# Patient Record
Sex: Male | Born: 1980 | Race: Black or African American | Hispanic: No | Marital: Married | Smoking: Never smoker
Health system: Southern US, Community
[De-identification: ages and names within clinical notes are randomized; demographics above are authoritative.]

## PROBLEM LIST (undated history)

## (undated) ENCOUNTER — Ambulatory Visit: Discharge status: Absent without leave | Payer: BLUE CROSS/BLUE SHIELD

## (undated) DIAGNOSIS — F32A Depression, unspecified: Secondary | ICD-10-CM

## (undated) DIAGNOSIS — T7840XA Allergy, unspecified, initial encounter: Secondary | ICD-10-CM

## (undated) HISTORY — DX: Allergy, unspecified, initial encounter: T78.40XA

## (undated) HISTORY — PX: HERNIA REPAIR: SHX51

## (undated) HISTORY — DX: Depression, unspecified: F32.A

---

## 2003-07-01 ENCOUNTER — Emergency Department (HOSPITAL_COMMUNITY): Admission: EM | Admit: 2003-07-01 | Discharge: 2003-07-01 | Payer: Self-pay

## 2008-06-03 ENCOUNTER — Emergency Department (HOSPITAL_COMMUNITY): Admission: EM | Admit: 2008-06-03 | Discharge: 2008-06-03 | Payer: Self-pay | Admitting: Emergency Medicine

## 2008-06-04 ENCOUNTER — Emergency Department (HOSPITAL_COMMUNITY): Admission: EM | Admit: 2008-06-04 | Discharge: 2008-06-05 | Payer: Self-pay | Admitting: Emergency Medicine

## 2008-06-09 ENCOUNTER — Emergency Department (HOSPITAL_COMMUNITY): Admission: EM | Admit: 2008-06-09 | Discharge: 2008-06-09 | Payer: Self-pay | Admitting: Family Medicine

## 2008-10-19 ENCOUNTER — Emergency Department (HOSPITAL_COMMUNITY): Admission: EM | Admit: 2008-10-19 | Discharge: 2008-10-19 | Payer: Self-pay | Admitting: Emergency Medicine

## 2011-09-05 LAB — DIFFERENTIAL
Eosinophils Absolute: 0.2
Eosinophils Relative: 3
Lymphs Abs: 1.7
Monocytes Absolute: 0.5

## 2011-09-05 LAB — POCT I-STAT, CHEM 8
BUN: 9
Calcium, Ion: 1.18
Creatinine, Ser: 1.2
Hemoglobin: 15
Sodium: 138
TCO2: 28

## 2011-09-05 LAB — CBC
HCT: 40.6
Hemoglobin: 13.9
MCV: 85.9
Platelets: 268
RDW: 13.9
WBC: 5.4

## 2017-02-21 ENCOUNTER — Encounter (HOSPITAL_COMMUNITY): Payer: Self-pay

## 2017-02-21 DIAGNOSIS — K130 Diseases of lips: Secondary | ICD-10-CM | POA: Insufficient documentation

## 2017-02-21 NOTE — ED Triage Notes (Signed)
Pt states that Monday he had a bump on the outside of his mouth and went to UC and placed on doxycycline, Pt states that on Wed his bottom lip and cheek started to swell, denies SOB. Speaks in full sentences.

## 2017-02-22 ENCOUNTER — Emergency Department (HOSPITAL_COMMUNITY)
Admission: EM | Admit: 2017-02-22 | Discharge: 2017-02-22 | Disposition: A | Discharge status: Home / Self Care | Payer: BLUE CROSS/BLUE SHIELD | Attending: Emergency Medicine | Admitting: Emergency Medicine

## 2017-02-22 DIAGNOSIS — K130 Diseases of lips: Secondary | ICD-10-CM

## 2017-02-22 MED ORDER — ACETAMINOPHEN 325 MG PO TABS
650.0000 mg | ORAL_TABLET | Freq: Once | ORAL | Status: AC
  Administered 2017-02-22: 650 mg via ORAL
  Filled 2017-02-22: qty 2

## 2017-02-22 NOTE — ED Provider Notes (Signed)
[facility] Provider Note   CSN: [id number] Arrival date & time: 02/21/17  1939     History   Chief Complaint Chief Complaint  Patient presents with  . Facial Swelling    HPI Reginald Bush is a 36 y.o. male who presents with lip swelling. He states that he noticed a "bump" on the left side of his lower lip on Monday. It worsened over the next couple days therefore he went to UC who placed him on Doxy and told him that he had an ingrown hair. He has been taking this medicine and has not missed any doses. Also been doing warm compresses but the pain and swelling has worsened. He has been taking Tylenol for pain which provides mild relief. Denies fever, drainage, rash, throat swelling, SOB.  HPI  History reviewed. No pertinent past medical history.  There are no active problems to display for this patient.   History reviewed. No pertinent surgical history.     Home Medications    Prior to Admission medications   Not on File    Family History No family history on file.  Social History Social History  Substance Use Topics  . Smoking status: Never Smoker  . Smokeless tobacco: Never Used  . Alcohol use No     Allergies   Shellfish allergy and Iodine   Review of Systems Review of Systems  Constitutional: Negative for fever.  HENT: Positive for facial swelling (lower lip). Negative for trouble swallowing.   Respiratory: Negative for shortness of breath.   Skin: Negative for rash.  All other systems reviewed and are negative.    Physical Exam Updated Vital Signs BP (!) 165/97 (BP Location: Right Arm)   Pulse 77   Temp 97.7 F (36.5 C) (Oral)   Resp 14   Ht 5\' 9"  (1.753 m)   Wt 98.4 kg   SpO2 100%   BMI 32.05 kg/m   Physical Exam  Constitutional: He is oriented to person, place, and time. He appears well-developed and well-nourished. No distress.  HENT:  Head: Normocephalic and atraumatic.  Eyes: Conjunctivae are normal. Pupils  are equal, round, and reactive to light. Right eye exhibits no discharge. Left eye exhibits no discharge. No scleral icterus.  Neck: Normal range of motion.  Cardiovascular: Normal rate.   Pulmonary/Chest: Effort normal. No respiratory distress.  Abdominal: He exhibits no distension.  Neurological: He is alert and oriented to person, place, and time.  Skin: Skin is warm and dry.  Abscess on left lower lip without drainage  Psychiatric: He has a normal mood and affect. His behavior is normal.  Nursing note and vitals reviewed.    ED Treatments / Results  Labs (all labs ordered are listed, but only abnormal results are displayed) Labs Reviewed - No data to display  EKG  EKG Interpretation None       Radiology No results found.  Procedures Procedures (including critical care time)  INCISION AND DRAINAGE Performed by: Bethel Born Consent: Verbal consent obtained. Risks and benefits: risks, benefits and alternatives were discussed Type: abscess  Body area: Left lower lip  Anesthesia: local infiltration  Incision was made with a scalpel.  Local anesthetic: lidocaine 2% with epinephrine  Anesthetic total: 3 ml  Complexity: simple Blunt dissection to break up loculations  Drainage: purulent  Drainage amount: 2cc   Packing material: none  Patient tolerance: Patient tolerated the procedure well with no immediate complications.     Medications Ordered in ED Medications  acetaminophen (TYLENOL) tablet 650 mg (650 mg Oral Given 02/22/17 0151)     Initial Impression / Assessment and Plan / ED Course  I have reviewed the triage vital signs and the nursing notes.  Pertinent labs & imaging results that were available during my care of the patient were reviewed by me and considered in my medical decision making (see chart for details).  36 year old male presents with abscess amenable to I&D. I&D performed and patient tolerated well. Patient is afebrile,  tolerating PO. Discussed wound care and signs of infection (fever, chills, increasing pain, redness, or drainage at site). Also advised continue Doxy and warm compresses. Return precautions given.   Final Clinical Impressions(s) / ED Diagnoses   Final diagnoses:  Abscess, lip    New Prescriptions There are no discharge medications for this patient.    Bethel BornKelly Marie Gekas, PA-C 02/22/17 1530    Dione Boozeavid Glick, MD 02/22/17 [id number]

## 2017-02-22 NOTE — Discharge Instructions (Signed)
Keep area clean Take Tylenol/Ibuprofen for pain Continue Doxycycline  Return if symptoms are worsening

## 2018-05-28 ENCOUNTER — Ambulatory Visit: Payer: Self-pay | Admitting: Nurse Practitioner

## 2018-05-28 VITALS — BP 130/88 | HR 82 | Temp 97.7°F | Resp 16 | Wt 234.0 lb

## 2018-05-28 DIAGNOSIS — Z0000 Encounter for general adult medical examination without abnormal findings: Secondary | ICD-10-CM

## 2018-05-28 MED ORDER — ALBUTEROL SULFATE HFA 108 (90 BASE) MCG/ACT IN AERS: 2.0000 | INHALATION_SPRAY | Freq: Four times a day (QID) | RESPIRATORY_TRACT | 0 refills | Status: DC | PRN

## 2018-05-28 NOTE — Patient Instructions (Addendum)
Health Maintenance, Male [phone] or [phone] A healthy lifestyle and preventive care is important for your health and wellness. Ask your health care provider about what schedule of regular examinations is right for you. What should I know about weight and diet? Eat a Healthy Diet  Eat plenty of vegetables, fruits, whole grains, low-fat dairy products, and lean protein.  Do not eat a lot of foods high in solid fats, added sugars, or salt.  Maintain a Healthy Weight Regular exercise can help you achieve or maintain a healthy weight. You should:  Do at least 150 minutes of exercise each week. The exercise should increase your heart rate and make you sweat (moderate-intensity exercise).  Do strength-training exercises at least twice a week.  Watch Your Levels of Cholesterol and Blood Lipids  Have your blood tested for lipids and cholesterol every 5 years starting at 37 years of age. If you are at high risk for heart disease, you should start having your blood tested when you are 37 years old. You may need to have your cholesterol levels checked more often if: ? Your lipid or cholesterol levels are high. ? You are older than 37 years of age. ? You are at high risk for heart disease.  What should I know about cancer screening? Many types of cancers can be detected early and may often be prevented. Lung Cancer  You should be screened every year for lung cancer if: ? You are a current smoker who has smoked for at least 30 years. ? You are a former smoker who has quit within the past 15 years.  Talk to your health care provider about your screening options, when you should start screening, and how often you should be screened.  Colorectal Cancer  Routine colorectal cancer screening usually begins at 37 years of age and should be repeated every 5-10 years until you are 37 years old. You may need to be screened more often if early forms of precancerous  polyps or small growths are found. Your health care provider may recommend screening at an earlier age if you have risk factors for colon cancer.  Your health care provider may recommend using home test kits to check for hidden blood in the stool.  A small camera at the end of a tube can be used to examine your colon (sigmoidoscopy or colonoscopy). This checks for the earliest forms of colorectal cancer.  Prostate and Testicular Cancer  Depending on your age and overall health, your health care provider may do certain tests to screen for prostate and testicular cancer.  Talk to your health care provider about any symptoms or concerns you have about testicular or prostate cancer.  Skin Cancer  Check your skin from head to toe regularly.  Tell your health care provider about any new moles or changes in moles, especially if: ? There is a change in a mole's size, shape, or color. ? You have a mole that is larger than a pencil eraser.  Always use sunscreen. Apply sunscreen liberally and repeat throughout the day.  Protect yourself by wearing long sleeves, pants, a wide-brimmed hat, and sunglasses when outside.  What should I know about heart disease, diabetes, and high blood pressure?  If you are 48-52 years of age, have your blood pressure checked every 3-5 years. If you are 49 years of age or older, have your blood pressure checked every year. You should have your blood pressure measured twice-once when you are at a  hospital or clinic, and once when you are not at a hospital or clinic. Record the average of the two measurements. To check your blood pressure when you are not at a hospital or clinic, you can use: ? An automated blood pressure machine at a pharmacy. ? A home blood pressure monitor.  Talk to your health care provider about your target blood pressure.  If you are between 41-43 years old, ask your health care provider if you should take aspirin to prevent heart  disease.  Have regular diabetes screenings by checking your fasting blood sugar level. ? If you are at a normal weight and have a low risk for diabetes, have this test once every three years after the age of 41. ? If you are overweight and have a high risk for diabetes, consider being tested at a younger age or more often.  A one-time screening for abdominal aortic aneurysm (AAA) by ultrasound is recommended for men aged 30-75 years who are current or former smokers. What should I know about preventing infection? Hepatitis B If you have a higher risk for hepatitis B, you should be screened for this virus. Talk with your health care provider to find out if you are at risk for hepatitis B infection. Hepatitis C Blood testing is recommended for:  Everyone born from [birth year] through 1965.  Anyone with known risk factors for hepatitis C.  Sexually Transmitted Diseases (STDs)  You should be screened each year for STDs including gonorrhea and chlamydia if: ? You are sexually active and are younger than 37 years of age. ? You are older than 37 years of age and your health care provider tells you that you are at risk for this type of infection. ? Your sexual activity has changed since you were last screened and you are at an increased risk for chlamydia or gonorrhea. Ask your health care provider if you are at risk.  Talk with your health care provider about whether you are at high risk of being infected with HIV. Your health care provider may recommend a prescription medicine to help prevent HIV infection.  What else can I do?  Schedule regular health, dental, and eye exams.  Stay current with your vaccines (immunizations).  Do not use any tobacco products, such as cigarettes, chewing tobacco, and e-cigarettes. If you need help quitting, ask your health care provider.  Limit alcohol intake to no more than 2 drinks per day. One drink equals 12 ounces of beer, 5 ounces of wine, or 1 ounces of  hard liquor.  Do not use street drugs.  Do not share needles.  Ask your health care provider for help if you need support or information about quitting drugs.  Tell your health care provider if you often feel depressed.  Tell your health care provider if you have ever been abused or do not feel safe at home. This information is not intended to replace advice given to you by your health care provider. Make sure you discuss any questions you have with your health care provider. Document Released: 05/23/2008 Document Revised: 07/24/2016 Document Reviewed: 08/29/2015 Elsevier Interactive Patient Education  2018 [place] 18-39 Years, Male Preventive care refers to lifestyle choices and visits with your health care provider that can promote health and wellness. What does preventive care include?  A yearly physical exam. This is also called an annual well check.  Dental exams once or twice a year.  Routine eye exams. Ask your health care  provider how often you should have your eyes checked.  Personal lifestyle choices, including: ? Daily care of your teeth and gums. ? Regular physical activity. ? Eating a healthy diet. ? Avoiding tobacco and drug use. ? Limiting alcohol use. ? Practicing safe sex. What happens during an annual well check? The services and screenings done by your health care provider during your annual well check will depend on your age, overall health, lifestyle risk factors, and family history of disease. Counseling Your health care provider may ask you questions about your:  Alcohol use.  Tobacco use.  Drug use.  Emotional well-being.  Home and relationship well-being.  Sexual activity.  Eating habits.  Work and work Statistician.  Screening You may have the following tests or measurements:  Height, weight, and BMI.  Blood pressure.  Lipid and cholesterol levels. These may be checked every 5 years starting at age  42.  Diabetes screening. This is done by checking your blood sugar (glucose) after you have not eaten for a while (fasting).  Skin check.  Hepatitis C blood test.  Hepatitis B blood test.  Sexually transmitted disease (STD) testing.  Discuss your test results, treatment options, and if necessary, the need for more tests with your health care provider. Vaccines Your health care provider may recommend certain vaccines, such as:  Influenza vaccine. This is recommended every year.  Tetanus, diphtheria, and acellular pertussis (Tdap, Td) vaccine. You may need a Td booster every 10 years.  Varicella vaccine. You may need this if you have not been vaccinated.  HPV vaccine. If you are 67 or younger, you may need three doses over 6 months.  Measles, mumps, and rubella (MMR) vaccine. You may need at least one dose of MMR.You may also need a second dose.  Pneumococcal 13-valent conjugate (PCV13) vaccine. You may need this if you have certain conditions and have not been vaccinated.  Pneumococcal polysaccharide (PPSV23) vaccine. You may need one or two doses if you smoke cigarettes or if you have certain conditions.  Meningococcal vaccine. One dose is recommended if you are age 88-21 years and a first-year college student living in a residence hall, or if you have one of several medical conditions. You may also need additional booster doses.  Hepatitis A vaccine. You may need this if you have certain conditions or if you travel or work in places where you may be exposed to hepatitis A.  Hepatitis B vaccine. You may need this if you have certain conditions or if you travel or work in places where you may be exposed to hepatitis B.  Haemophilus influenzae type b (Hib) vaccine. You may need this if you have certain risk factors.  Talk to your health care provider about which screenings and vaccines you need and how often you need them. This information is not intended to replace advice given  to you by your health care provider. Make sure you discuss any questions you have with your health care provider. Document Released: 01/21/2002 Document Revised: 08/14/2016 Document Reviewed: 09/26/2015 Elsevier Interactive Patient Education  2018 [employer].  Preventing Hypertension Hypertension, commonly called high blood pressure, is when the force of blood pumping through the arteries is too strong. Arteries are blood vessels that carry blood from the heart throughout the body. Over time, hypertension can damage the arteries and decrease blood flow to important parts of the body, including the brain, heart, and kidneys. Often, hypertension does not cause symptoms until blood pressure is very high. For this  reason, it is important to have your blood pressure checked on a regular basis. Hypertension can often be prevented with diet and lifestyle changes. If you already have hypertension, you can control it with diet and lifestyle changes, as well as medicine. What nutrition changes can be made? Maintain a healthy diet. This includes:  Eating less salt (sodium). Ask your health care provider how much sodium is safe for you to have. The general recommendation is to consume less than 1 tsp (2,300 mg) of sodium a day. ? Do not add salt to your food. ? Choose low-sodium options when grocery shopping and eating out.  Limiting fats in your diet. You can do this by eating low-fat or fat-free dairy products and by eating less red meat.  Eating more fruits, vegetables, and whole grains. Make a goal to eat: ? 1-2 cups of fresh fruits and vegetables each day. ? 3-4 servings of whole grains each day.  Avoiding foods and beverages that have added sugars.  Eating fish that contain healthy fats (omega-3 fatty acids), such as mackerel or salmon.  If you need help putting together a healthy eating plan, try the DASH diet. This diet is high in fruits, vegetables, and whole grains. It is low in sodium, red  meat, and added sugars. DASH stands for Dietary Approaches to Stop Hypertension. What lifestyle changes can be made?  Lose weight if you are overweight. Losing just 3?5% of your body weight can help prevent or control hypertension. ? For example, if your present weight is 200 lb (91 kg), a loss of 3-5% of your weight means losing 6-10 lb (2.7-4.5 kg). ? Ask your health care provider to help you with a diet and exercise plan to safely lose weight.  Get enough exercise. Do at least 150 minutes of moderate-intensity exercise each week. ? You could do this in short exercise sessions several times a day, or you could do longer exercise sessions a few times a week. For example, you could take a brisk 10-minute walk or bike ride, 3 times a day, for 5 days a week.  Find ways to reduce stress, such as exercising, meditating, listening to music, or taking a yoga class. If you need help reducing stress, ask your health care provider.  Do not smoke. This includes e-cigarettes. Chemicals in tobacco and nicotine products raise your blood pressure each time you smoke. If you need help quitting, ask your health care provider.  Avoid alcohol. If you drink alcohol, limit alcohol intake to no more than 1 drink a day for nonpregnant women and 2 drinks a day for men. One drink equals 12 oz of beer, 5 oz of wine, or 1 oz of hard liquor. Why are these changes important? Diet and lifestyle changes can help you prevent hypertension, and they may make you feel better overall and improve your quality of life. If you have hypertension, making these changes will help you control it and help prevent major complications, such as:  Hardening and narrowing of arteries that supply blood to: ? Your heart. This can cause a heart attack. ? Your brain. This can cause a stroke. ? Your kidneys. This can cause kidney failure.  Stress on your heart muscle, which can cause heart failure.  What can I do to lower my risk?  Work with  your health care provider to make a hypertension prevention plan that works for you. Follow your plan and keep all follow-up visits as told by your health care provider.  Learn how to check your blood pressure at home. Make sure that you know your personal target blood pressure, as told by your health care provider. How is this treated? In addition to diet and lifestyle changes, your health care provider may recommend medicines to help lower your blood pressure. You may need to try a few different medicines to find what works best for you. You also may need to take more than one medicine. Take over-the-counter and prescription medicines only as told by your health care provider. Where to find support: Your health care provider can help you prevent hypertension and help you keep your blood pressure at a healthy level. Your local hospital or your community may also provide support services and prevention programs. The American Heart Association offers an online support network at: CheapBootlegs.com.cy Where to find more information: Learn more about hypertension from:  National Heart, Lung, and Blood Institute: ElectronicHangman.is  Centers for Disease Control and Prevention: https://ingram.com/  American Academy of Family Physicians: http://familydoctor.org/familydoctor/en/diseases-conditions/high-blood-pressure.printerview.all.html  Learn more about the DASH diet from:  [place], Lung, and [place]: https://www.reyes.com/  Contact a health care provider if:  You think you are having a reaction to medicines you have taken.  You have recurrent headaches or feel dizzy.  You have swelling in your ankles.  You have trouble with your vision. Summary  Hypertension often does not cause any symptoms until blood pressure is very high. It is important to get your blood pressure checked  regularly.  Diet and lifestyle changes are the most important steps in preventing hypertension.  By keeping your blood pressure in a healthy range, you can prevent complications like heart attack, heart failure, stroke, and kidney failure.  Work with your health care provider to make a hypertension prevention plan that works for you. This information is not intended to replace advice given to you by your health care provider. Make sure you discuss any questions you have with your health care provider. Document Released: 12/10/2015 Document Revised: 08/05/2016 Document Reviewed: 08/05/2016 Elsevier Interactive Patient Education  2018 [place] Eating Plan DASH stands for "Dietary Approaches to Stop Hypertension." The DASH eating plan is a healthy eating plan that has been shown to reduce high blood pressure (hypertension). It may also reduce your risk for type 2 diabetes, heart disease, and stroke. The DASH eating plan may also help with weight loss. What are tips for following this plan? General guidelines  Avoid eating more than 2,300 mg (milligrams) of salt (sodium) a day. If you have hypertension, you may need to reduce your sodium intake to 1,500 mg a day.  Limit alcohol intake to no more than 1 drink a day for nonpregnant women and 2 drinks a day for men. One drink equals 12 oz of beer, 5 oz of wine, or 1 oz of hard liquor.  Work with your health care provider to maintain a healthy body weight or to lose weight. Ask what an ideal weight is for you.  Get at least 30 minutes of exercise that causes your heart to beat faster (aerobic exercise) most days of the week. Activities may include walking, swimming, or biking.  Work with your health care provider or diet and nutrition specialist (dietitian) to adjust your eating plan to your individual calorie needs. Reading food labels  Check food labels for the amount of sodium per serving. Choose foods with less than 5 percent of  the Daily Value of sodium. Generally, foods with less than 300 mg of  sodium per serving fit into this eating plan.  To find whole grains, look for the word "whole" as the first word in the ingredient list. Shopping  Buy products labeled as "low-sodium" or "no salt added."  Buy fresh foods. Avoid canned foods and premade or frozen meals. Cooking  Avoid adding salt when cooking. Use salt-free seasonings or herbs instead of table salt or sea salt. Check with your health care provider or pharmacist before using salt substitutes.  Do not fry foods. Cook foods using healthy methods such as baking, boiling, grilling, and broiling instead.  Cook with heart-healthy oils, such as olive, canola, soybean, or sunflower oil. Meal planning   Eat a balanced diet that includes: ? 5 or more servings of fruits and vegetables each day. At each meal, try to fill half of your plate with fruits and vegetables. ? Up to 6-8 servings of whole grains each day. ? Less than 6 oz of lean meat, poultry, or fish each day. A 3-oz serving of meat is about the same size as a deck of cards. One egg equals 1 oz. ? 2 servings of low-fat dairy each day. ? A serving of nuts, seeds, or beans 5 times each week. ? Heart-healthy fats. Healthy fats called Omega-3 fatty acids are found in foods such as flaxseeds and coldwater fish, like sardines, salmon, and mackerel.  Limit how much you eat of the following: ? Canned or prepackaged foods. ? Food that is high in trans fat, such as fried foods. ? Food that is high in saturated fat, such as fatty meat. ? Sweets, desserts, sugary drinks, and other foods with added sugar. ? Full-fat dairy products.  Do not salt foods before eating.  Try to eat at least 2 vegetarian meals each week.  Eat more home-cooked food and less restaurant, buffet, and fast food.  When eating at a restaurant, ask that your food be prepared with less salt or no salt, if possible. What foods are  recommended? The items listed may not be a complete list. Talk with your dietitian about what dietary choices are best for you. Grains Whole-grain or whole-wheat bread. Whole-grain or whole-wheat pasta. Brown rice. [place]. Bulgur. Whole-grain and low-sodium cereals. Pita bread. Low-fat, low-sodium crackers. Whole-wheat flour tortillas. Vegetables Fresh or frozen vegetables (raw, steamed, roasted, or grilled). Low-sodium or reduced-sodium tomato and vegetable juice. Low-sodium or reduced-sodium tomato sauce and tomato paste. Low-sodium or reduced-sodium canned vegetables. Fruits All fresh, dried, or frozen fruit. Canned fruit in natural juice (without added sugar). Meat and other protein foods Skinless chicken or [place]. Ground chicken or [place]. Pork with fat trimmed off. Fish and seafood. Egg whites. Dried beans, peas, or lentils. Unsalted nuts, nut butters, and seeds. Unsalted canned beans. Lean cuts of beef with fat trimmed off. Low-sodium, lean deli meat. Dairy Low-fat (1%) or fat-free (skim) milk. Fat-free, low-fat, or reduced-fat cheeses. Nonfat, low-sodium ricotta or cottage cheese. Low-fat or nonfat yogurt. Low-fat, low-sodium cheese. Fats and oils Soft margarine without trans fats. Vegetable oil. Low-fat, reduced-fat, or light mayonnaise and salad dressings (reduced-sodium). Canola, safflower, olive, soybean, and sunflower oils. Avocado. Seasoning and other foods Herbs. Spices. Seasoning mixes without salt. Unsalted popcorn and pretzels. Fat-free sweets. What foods are not recommended? The items listed may not be a complete list. Talk with your dietitian about what dietary choices are best for you. Grains Baked goods made with fat, such as croissants, muffins, or some breads. Dry pasta or rice meal packs. Vegetables Creamed or fried vegetables.  Vegetables in a cheese sauce. Regular canned vegetables (not low-sodium or reduced-sodium). Regular canned tomato sauce and paste (not  low-sodium or reduced-sodium). Regular tomato and vegetable juice (not low-sodium or reduced-sodium). Angie Fava. Olives. Fruits Canned fruit in a light or heavy syrup. Fried fruit. Fruit in cream or butter sauce. Meat and other protein foods Fatty cuts of meat. Ribs. Fried meat. Berniece Salines. Sausage. Bologna and other processed lunch meats. Salami. Fatback. Hotdogs. Bratwurst. Salted nuts and seeds. Canned beans with added salt. Canned or smoked fish. Whole eggs or egg yolks. Chicken or [place] with skin. Dairy Whole or 2% milk, cream, and half-and-half. Whole or full-fat cream cheese. Whole-fat or sweetened yogurt. Full-fat cheese. Nondairy creamers. Whipped toppings. Processed cheese and cheese spreads. Fats and oils Butter. Stick margarine. Lard. Shortening. Ghee. Bacon fat. Tropical oils, such as coconut, palm kernel, or palm oil. Seasoning and other foods Salted popcorn and pretzels. Onion salt, garlic salt, seasoned salt, table salt, and sea salt. Worcestershire sauce. Tartar sauce. Barbecue sauce. Teriyaki sauce. Soy sauce, including reduced-sodium. Steak sauce. Canned and packaged gravies. Fish sauce. Oyster sauce. Cocktail sauce. Horseradish that you find on the shelf. Ketchup. Mustard. Meat flavorings and tenderizers. Bouillon cubes. Hot sauce and Tabasco sauce. Premade or packaged marinades. Premade or packaged taco seasonings. Relishes. Regular salad dressings. Where to find more information:  National Heart, Lung, and [place]: https://wilson-eaton.com/  American Heart Association: www.heart.org Summary  The DASH eating plan is a healthy eating plan that has been shown to reduce high blood pressure (hypertension). It may also reduce your risk for type 2 diabetes, heart disease, and stroke.  With the DASH eating plan, you should limit salt (sodium) intake to 2,300 mg a day. If you have hypertension, you may need to reduce your sodium intake to 1,500 mg a day.  When on the DASH eating plan,  aim to eat more fresh fruits and vegetables, whole grains, lean proteins, low-fat dairy, and heart-healthy fats.  Work with your health care provider or diet and nutrition specialist (dietitian) to adjust your eating plan to your individual calorie needs. This information is not intended to replace advice given to you by your health care provider. Make sure you discuss any questions you have with your health care provider. Document Released: 11/14/2011 Document Revised: 11/18/2016 Document Reviewed: 11/18/2016 Elsevier Interactive Patient Education  [employer].

## 2018-05-28 NOTE — Progress Notes (Signed)
Subjective:  Reginald Bush is a 37 y.o. male who presents for basic physical exam.  The patient is here for health assessment as a requirement for [place] to keep his insurance premiums low.  Patient denies any current health related concerns today.  Patient denies any history of heart disease, lung disease, kidney disease, diabetes, seizures, or hypertension.  The patient does not take any medications currently, and is not allergic to any medications.  The patient does have allergies to pollen/dust, and pet dander.  The patient currently does not have any medications for this condition.  The patient states his immunizations are up-to-date to the best of his knowledge.  The patient and his wife are sexually active, and he states they use condoms for prophylaxis.  The patient has a past surgical history of hernia surgery as a child.  The patient is married and has 2 daughters, both whom are healthy.  The patient has a family history of hypertension on his mother's side, and PTSD and manic depression on his father's side due to his father being a Cytogeneticist..  Both parents are alive.  The patient has 5 siblings, 2 brothers, and 3 sisters.  The patient states at least 3 of his siblings, he is not sure which 3, have hypertension.  The patient denies the use of recreational drugs, does not smoke, and does not drink.    Social History   Tobacco Use  . Smoking status: Never Smoker  . Smokeless tobacco: Never Used  Substance Use Topics  . Alcohol use: No  . Drug use: Not on file    Allergies  Allergen Reactions  . Shellfish Allergy Anaphylaxis  . Iodine Rash    Current Outpatient Medications  Medication Sig Dispense Refill  . fluticasone (FLONASE) 50 MCG/ACT nasal spray Place into the nose.     No current facility-administered medications for this visit.     Review of Systems  Constitutional: Negative.   HENT: Negative.   Eyes: Negative.   Respiratory: Negative.   Cardiovascular:  Negative.   Gastrointestinal: Negative.   Genitourinary: Negative.   Musculoskeletal: Negative.   Skin: Negative.   Neurological: Negative.   Endo/Heme/Allergies: Negative.   Psychiatric/Behavioral: Negative.     Objective:  BP 130/88 (BP Location: Right Arm, Patient Position: Sitting, Cuff Size: Normal)   Pulse 82   Temp 97.7 F (36.5 C) (Oral)   Resp 16   Wt 234 lb (106.1 kg)   SpO2 97%   BMI 34.56 kg/m   General Appearance:  Alert, cooperative, no distress, appears stated age  Head:  Normocephalic, without obvious abnormality, atraumatic  Eyes:  PERRL, conjunctiva/corneas clear, EOM's intact, fundi benign, both eyes  Ears:  Normal TM's and external ear canals, both ears  Nose: Nares normal, septum midline, mucosa normal, no drainage or sinus tenderness  Throat: Lips, mucosa, and tongue normal; teeth and gums normal  Neck: Supple, symmetrical, trachea midline, no adenopathy, thyroid: not enlarged, symmetric, no tenderness/mass/nodules, no carotid bruit or JVD  Back:   Symmetric, no curvature, ROM normal, no CVA tenderness  Lungs:   Clear to auscultation bilaterally, respirations unlabored  Chest Wall:  No tenderness or deformity  Heart:  Regular rate and rhythm, S1, S2 normal, no murmur, rub or gallop  Abdomen:   Soft, non-tender, bowel sounds active all four quadrants,  no masses, no organomegaly  Genitalia:  Deferred  Rectal:  Deferred  Extremities: Extremities normal, atraumatic, no cyanosis or edema  Pulses: 2+ and symmetric  Skin:  Skin color, texture, turgor normal, no rashes or lesions  Lymph nodes: Cervical, supraclavicular, and axillary nodes normal  Neurologic: Normal      Assessment:  basic physical exam    Plan:  Patient education provided.  Patient does not have a PCP at this time, so the patient engagement center number was provided to the patient.  The patient was also given education on preventing hypertension, the DASH diet, health maintenance, and  health prevention for his age group.  Discussed at length with patient ways to avoid getting on medications for hypertension and the importance of managing his blood pressure.  Informed patient that once he does establish primary care with a physician, he will be able to have further diagnostic screenings, and lab work.  The patient was also given a refill on his albuterol inhaler.  The patient verbalizes understanding and has no questions at time of discharge.   Meds ordered this encounter  Medications  . albuterol (PROVENTIL HFA;VENTOLIN HFA) 108 (90 Base) MCG/ACT inhaler    Sig: Inhale 2 puffs into the lungs every 6 (six) hours as needed for wheezing or shortness of breath.    Dispense:  1 Inhaler    Refill:  0    Order Specific Question:   Supervising Provider    Answer:   Stacie GlazeJENKINS, JOHN E [phone]]

## 2018-08-02 ENCOUNTER — Emergency Department (HOSPITAL_BASED_OUTPATIENT_CLINIC_OR_DEPARTMENT_OTHER)
Admission: EM | Admit: 2018-08-02 | Discharge: 2018-08-02 | Disposition: A | Discharge status: Home / Self Care | Payer: 59 | Attending: Emergency Medicine | Admitting: Emergency Medicine

## 2018-08-02 ENCOUNTER — Other Ambulatory Visit: Payer: Self-pay

## 2018-08-02 ENCOUNTER — Encounter (HOSPITAL_BASED_OUTPATIENT_CLINIC_OR_DEPARTMENT_OTHER): Payer: Self-pay | Admitting: Emergency Medicine

## 2018-08-02 DIAGNOSIS — R030 Elevated blood-pressure reading, without diagnosis of hypertension: Secondary | ICD-10-CM | POA: Insufficient documentation

## 2018-08-02 DIAGNOSIS — L723 Sebaceous cyst: Secondary | ICD-10-CM | POA: Diagnosis not present

## 2018-08-02 DIAGNOSIS — I10 Essential (primary) hypertension: Secondary | ICD-10-CM | POA: Diagnosis not present

## 2018-08-02 DIAGNOSIS — R220 Localized swelling, mass and lump, head: Secondary | ICD-10-CM | POA: Diagnosis present

## 2018-08-02 MED ORDER — ACETAMINOPHEN 500 MG PO TABS
ORAL_TABLET | ORAL | Status: AC
  Filled 2018-08-02: qty 2

## 2018-08-02 MED ORDER — LIDOCAINE HCL (PF) 1 % IJ SOLN
5.0000 mL | Freq: Once | INTRAMUSCULAR | Status: AC
  Administered 2018-08-02: 5 mL via INTRADERMAL
  Filled 2018-08-02: qty 5

## 2018-08-02 MED ORDER — ACETAMINOPHEN 500 MG PO TABS
1000.0000 mg | ORAL_TABLET | Freq: Once | ORAL | Status: AC
  Administered 2018-08-02: 1000 mg via ORAL

## 2018-08-02 MED ORDER — DOXYCYCLINE HYCLATE 100 MG PO CAPS: 100.0000 mg | ORAL_CAPSULE | Freq: Two times a day (BID) | ORAL | 0 refills | Status: DC

## 2018-08-02 NOTE — ED Triage Notes (Signed)
Swelling to R cheek. States he has a history of abscess to that area.

## 2018-08-02 NOTE — Discharge Instructions (Signed)
Take tylenol for pain. Apply ice pack to reduction of inflammation and swelling.  Follow closely with dermatology or surgery Return to the ER for the following reasons:  Your cyst or abscess returns. You have a fever. You have more redness, swelling, or pain around your incision. You have more fluid or blood coming from your incision. Your incision feels warm to the touch. You have pus or a bad smell coming from your incision. You have severe pain or bleeding. You cannot eat or drink without vomiting. You have decreased urine output. You become short of breath. You have chest pain. You cough up blood. The area where the incision and drainage occurred becomes numb or it tingles.

## 2018-08-02 NOTE — ED Provider Notes (Signed)
[facility] EMERGENCY DEPARTMENT Provider Note   CSN: [id number] Arrival date & time: 08/02/18  1021     History   Chief Complaint Chief Complaint  Patient presents with  . Facial Swelling    HPI Reginald Bush is a 37 y.o. male who presents the emergency department chief complaint of abscess of the face.  Patient states he has a previous history of getting infected hair follicles and has had to have them drained previously.  He has had 3 days of worsening pain swelling and tenderness over the right side of his cheek.  He denies any dental issues, fevers or chills.  HPI  History reviewed. No pertinent past medical history.  There are no active problems to display for this patient.   History reviewed. No pertinent surgical history.      Home Medications    Prior to Admission medications   Medication Sig Start Date End Date Taking? Authorizing Provider  albuterol (PROVENTIL HFA;VENTOLIN HFA) 108 (90 Base) MCG/ACT inhaler Inhale 2 puffs into the lungs every 6 (six) hours as needed for wheezing or shortness of breath. 05/28/18 06/27/18  Benay PikeLeath, Christie Janell, NP  fluticasone (FLONASE) 50 MCG/ACT nasal spray Place into the nose. 04/17/15   [provider]    Family History No family history on file.  Social History Social History   Tobacco Use  . Smoking status: Never Smoker  . Smokeless tobacco: Never Used  Substance Use Topics  . Alcohol use: No  . Drug use: Never     Allergies   Other; Shellfish allergy; and Iodine   Review of Systems Review of Systems  Positive for boil, negative for dental pain, difficulty swallowing, fevers, chills. Physical Exam Updated Vital Signs BP (!) 161/95 (BP Location: Left Arm)   Pulse 72   Temp 98.1 F (36.7 C) (Oral)   Resp 18   Ht 5\' 9"  (1.753 m)   Wt 104.3 kg   SpO2 100%   BMI 33.97 kg/m   Physical Exam  Constitutional: He appears well-developed and well-nourished. No distress.  HENT:    Head: Normocephalic and atraumatic.  Tender, well-circumscribed 3 cm nodule on the right side of the face with central pustule.  Eyes: Pupils are equal, round, and reactive to light. Conjunctivae and EOM are normal. No scleral icterus.  Neck: Normal range of motion. Neck supple.  Cardiovascular: Normal rate, regular rhythm and normal heart sounds.  Pulmonary/Chest: Effort normal and breath sounds normal. No respiratory distress.  Abdominal: Soft. There is no tenderness.  Musculoskeletal: He exhibits no edema. Tenderness: .10ros.  Neurological: He is alert.  Skin: Skin is warm and dry. He is not diaphoretic.  Psychiatric: His behavior is normal.  Nursing note and vitals reviewed.    ED Treatments / Results  Labs (all labs ordered are listed, but only abnormal results are displayed) Labs Reviewed - No data to display  EKG None  Radiology No results found.  Procedures .Marland Kitchen.Incision and Drainage Date/Time: 08/02/2018 7:44 PM Performed by: Arthor CaptainHarris, Yolani Vo, PA-C Authorized by: Arthor CaptainHarris, Georgio Hattabaugh, PA-C   Consent:    Consent obtained:  Verbal   Consent given by:  Patient   Risks discussed:  Bleeding, incomplete drainage, pain and damage to other organs   Alternatives discussed:  No treatment Universal protocol:    Procedure explained and questions answered to patient or proxy's satisfaction: yes     Relevant documents present and verified: yes     Test results available and properly labeled: yes  Imaging studies available: yes     Required blood products, implants, devices, and special equipment available: yes     Site/side marked: yes     Immediately prior to procedure a time out was called: yes     Patient identity confirmed:  Verbally with patient Location:    Type:  Cyst   Location:  Head   Head location:  Face Pre-procedure details:    Skin preparation:  Betadine Anesthesia (see MAR for exact dosages):    Anesthesia method:  Local infiltration   Local anesthetic:   Lidocaine 1% w/o epi Procedure type:    Complexity:  Simple Procedure details:    Incision types:  Single straight   Incision depth:  Subcutaneous   Scalpel blade:  11   Wound management:  Probed and deloculated, irrigated with saline and extensive cleaning   Drainage characteristics: Purulent and sebaceous.   Drainage amount:  Scant   Wound treatment:  Wound left open Post-procedure details:    Patient tolerance of procedure:  Tolerated well, no immediate complications   (including critical care time)  Medications Ordered in ED Medications  lidocaine (PF) (XYLOCAINE) 1 % injection 5 mL (has no administration in time range)     Initial Impression / Assessment and Plan / ED Course  I have reviewed the triage vital signs and the nursing notes.  Pertinent labs & imaging results that were available during my care of the patient were reviewed by me and considered in my medical decision making (see chart for details).     Patient with skin abscess amenable to incision and drainage.  Abscess was not large enough to warrant packing or drain,  wound recheck in 2 days. Encouraged home warm soaks and flushing.  Mild signs of cellulitis is surrounding skin.  Will d/c to home.   Final Clinical Impressions(s) / ED Diagnoses   Final diagnoses:  Inflamed sebaceous cyst  Elevated blood pressure reading    ED Discharge Orders    None       Arthor Captain, PA-C 08/02/18 1946    Mesner, Barbara Cower, MD 08/04/18 [phone]

## 2018-08-04 DIAGNOSIS — L723 Sebaceous cyst: Secondary | ICD-10-CM | POA: Diagnosis not present

## 2019-06-25 DIAGNOSIS — Z131 Encounter for screening for diabetes mellitus: Secondary | ICD-10-CM | POA: Diagnosis not present

## 2019-06-25 DIAGNOSIS — Z6833 Body mass index (BMI) 33.0-33.9, adult: Secondary | ICD-10-CM | POA: Diagnosis not present

## 2019-06-25 DIAGNOSIS — Z130 Encounter for screening for diseases of the blood and blood-forming organs and certain disorders involving the immune mechanism: Secondary | ICD-10-CM | POA: Diagnosis not present

## 2019-06-25 DIAGNOSIS — Z13220 Encounter for screening for lipoid disorders: Secondary | ICD-10-CM | POA: Diagnosis not present

## 2019-06-25 DIAGNOSIS — Z0000 Encounter for general adult medical examination without abnormal findings: Secondary | ICD-10-CM | POA: Diagnosis not present

## 2020-02-21 ENCOUNTER — Ambulatory Visit (HOSPITAL_BASED_OUTPATIENT_CLINIC_OR_DEPARTMENT_OTHER)
Admission: RE | Admit: 2020-02-21 | Discharge: 2020-02-21 | Disposition: A | Discharge status: Home / Self Care | Payer: 59 | Source: Ambulatory Visit | Attending: Family Medicine | Admitting: Family Medicine

## 2020-02-21 ENCOUNTER — Ambulatory Visit (INDEPENDENT_AMBULATORY_CARE_PROVIDER_SITE_OTHER): Payer: 59 | Admitting: Family Medicine

## 2020-02-21 ENCOUNTER — Encounter: Payer: Self-pay | Admitting: Family Medicine

## 2020-02-21 ENCOUNTER — Other Ambulatory Visit: Payer: Self-pay

## 2020-02-21 VITALS — BP 154/95 | HR 73 | Ht 70.0 in | Wt 230.0 lb

## 2020-02-21 DIAGNOSIS — M5136 Other intervertebral disc degeneration, lumbar region: Secondary | ICD-10-CM | POA: Diagnosis not present

## 2020-02-21 DIAGNOSIS — M5442 Lumbago with sciatica, left side: Secondary | ICD-10-CM

## 2020-02-21 NOTE — Progress Notes (Signed)
  Tray Klayman - 39 y.o. male MRN [id number]  Date of birth: 1981/06/20  SUBJECTIVE:  Including CC & ROS.  Chief Complaint  Patient presents with  . Back Pain    bilateral low back    Linzie Boursiquot is a 39 y.o. male that is presenting with low back pain with intermittent left-sided radicular symptoms.  Pain was worse over this weekend.  He has trouble getting up from seated position.  Pain seems be localized to the left lower back.  No specific inciting event.  No history of surgery.   Review of Systems See HPI   HISTORY: Past Medical, Surgical, Social, and Family History Reviewed & Updated per EMR.   Pertinent Historical Findings include:  No past medical history on file.  No past surgical history on file.  No family history on file.  Social History   Socioeconomic History  . Marital status: Single    Spouse name: Not on file  . Number of children: Not on file  . Years of education: Not on file  . Highest education level: Not on file  Occupational History  . Not on file  Tobacco Use  . Smoking status: Never Smoker  . Smokeless tobacco: Never Used  Substance and Sexual Activity  . Alcohol use: No  . Drug use: Never  . Sexual activity: Not on file  Other Topics Concern  . Not on file  Social History Narrative  . Not on file   Social Determinants of Health   Financial Resource Strain:   . Difficulty of Paying Living Expenses:   Food Insecurity:   . Worried About Programme researcher, broadcasting/film/video in the Last Year:   . Barista in the Last Year:   Transportation Needs:   . Freight forwarder (Medical):   Marland Kitchen Lack of Transportation (Non-Medical):   Physical Activity:   . Days of Exercise per Week:   . Minutes of Exercise per Session:   Stress:   . Feeling of Stress :   Social Connections:   . Frequency of Communication with Friends and Family:   . Frequency of Social Gatherings with Friends and Family:   . Attends Religious Services:   . Active Member  of Clubs or Organizations:   . Attends Banker Meetings:   Marland Kitchen Marital Status:   Intimate Partner Violence:   . Fear of Current or Ex-Partner:   . Emotionally Abused:   Marland Kitchen Physically Abused:   . Sexually Abused:      PHYSICAL EXAM:  VS: BP (!) 154/95   Pulse 73   Ht 5\' 10"  (1.778 m)   Wt 230 lb (104.3 kg)   BMI 33.00 kg/m  Physical Exam Gen: NAD, alert, cooperative with exam, well-appearing MSK:  Back: Normal flexion. Some pain with extension. Normal hip flexion. Less mobile with hip flexion and abduction on the left when compared to the right. Normal internal and external rotation of the hips. Normal strength resistance with plantarflexion and dorsiflexion. Negative straight leg raise. Neurovascularly intact     ASSESSMENT & PLAN:   Acute left-sided low back pain with left-sided sciatica Seems more spasm related with his movements.  Having intermittent radicular type symptoms. -Provided Duexis samples. -X-ray. -Counseled on home exercise therapy and supportive care. -Could consider physical therapy

## 2020-02-21 NOTE — Progress Notes (Signed)
Medication Samples have been provided to the patient.  Drug name: Duexis       Strength: 800mg /26.6mg         Qty: 2 Boxes  LOT and : [id number]  Exp.Date: 02/22 05-10-1977) and 01/22 06-03-1985)  Dosing instructions: Take 1 tablet by mouth three (3) times a day.  The patient has been instructed regarding the correct time, dose, and frequency of taking this medication, including desired effects and most common side effects.   ([id number], Kathi Simpers 4:53 PM 02/21/2020

## 2020-02-21 NOTE — Assessment & Plan Note (Signed)
Seems more spasm related with his movements.  Having intermittent radicular type symptoms. -Provided Duexis samples. -X-ray. -Counseled on home exercise therapy and supportive care. -Could consider physical therapy

## 2020-02-21 NOTE — Patient Instructions (Addendum)
Nice to meet you Please try heat  Please try the exercises.  Please try the duexis as needed  I will call with the results.  Please send me a message in MyChart with any questions or updates.  Please see me back in 4 weeks.   --Dr. Jordan Likes

## 2020-02-22 ENCOUNTER — Telehealth: Payer: Self-pay | Admitting: Family Medicine

## 2020-02-22 NOTE — Telephone Encounter (Signed)
Left VM for patient. 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 This can cause pain in the back but shouldn't cause pain down the leg. Rehab exercises and physical therapy can help with this.   If any questions then please take the best time and phone number to call and I will try to call him back.   Myra Rude, MD [facility] 02/22/2020, 9:16 AM

## 2020-02-22 NOTE — Telephone Encounter (Signed)
Spoke to patient and gave him result information as provided by the physician. 

## 2020-02-22 NOTE — Telephone Encounter (Signed)
Patient returning call for results 

## 2020-03-13 ENCOUNTER — Telehealth: Payer: Self-pay | Admitting: Family Medicine

## 2020-03-13 DIAGNOSIS — M5442 Lumbago with sciatica, left side: Secondary | ICD-10-CM

## 2020-03-13 NOTE — Telephone Encounter (Signed)
Patient is still having pain in his lower back. He said pain has increased. He is concerned that his pain is related to nerve damage. Patient is asking if he should come back in the office for a follow up visit or if there is additional testing you would suggest   He would also like a referral for physical therapy

## 2020-03-14 ENCOUNTER — Telehealth: Payer: Self-pay | Admitting: Family Medicine

## 2020-03-14 NOTE — Telephone Encounter (Signed)
Left VM for patient. If he calls back please have him speak with a nurse/CMA and inform that bad nerve damage is usually occurring when someone cannot control their bladder and has numbness around the pelvis or in their leg or can't walk.  We can try a course of prednisone to see if he responds to that. I will make a referral to physical therapy.   If any questions then please take the best time and phone number to call and I will try to call him back.   Myra Rude, MD [facility] 03/14/2020, 8:20 AM

## 2020-03-14 NOTE — Telephone Encounter (Signed)
Patient returned call. I informed him of advice. Patient states he has been experiencing numbness in his lower back and down his left leg for about two weeks.   Patient requesting a call back to discuss tests to determine nerve damage.

## 2020-03-15 NOTE — Telephone Encounter (Signed)
Left VM for patient. If he calls back please have him speak with a nurse/CMA and inform that we can discuss his questions.   If any questions then please take the best time and phone number to call and I will try to call him back.   Myra Rude, MD [facility] 03/15/2020, 9:08 AM

## 2020-03-20 ENCOUNTER — Ambulatory Visit: Payer: 59 | Admitting: Family Medicine

## 2020-03-22 ENCOUNTER — Other Ambulatory Visit: Payer: Self-pay

## 2020-03-22 ENCOUNTER — Ambulatory Visit: Payer: 59 | Attending: Family Medicine | Admitting: Physical Therapy

## 2020-03-22 ENCOUNTER — Encounter: Payer: Self-pay | Admitting: Physical Therapy

## 2020-03-22 DIAGNOSIS — R29898 Other symptoms and signs involving the musculoskeletal system: Secondary | ICD-10-CM | POA: Diagnosis not present

## 2020-03-22 DIAGNOSIS — M5442 Lumbago with sciatica, left side: Secondary | ICD-10-CM | POA: Insufficient documentation

## 2020-03-22 DIAGNOSIS — R293 Abnormal posture: Secondary | ICD-10-CM | POA: Insufficient documentation

## 2020-03-22 DIAGNOSIS — M6281 Muscle weakness (generalized): Secondary | ICD-10-CM | POA: Diagnosis not present

## 2020-03-22 NOTE — Therapy (Addendum)
[facility] [address]  [address] [place], [place], [address] Phone: [phone]   Fax:  [phone]  Physical Therapy Evaluation  Patient Details  Name: Reginald Bush MRN: [id number] Date of Birth: August 24, 1981 Referring Provider (PT): Clare Gandy, MD   Encounter Date: 03/22/2020  PT End of Session - 03/22/20 1152    Visit Number  1    Number of Visits  12    Date for PT Re-Evaluation  05/03/20    Authorization Type  [facility]    PT Start Time  1152    PT Stop Time  1255    PT Time Calculation (min)  63 min    Activity Tolerance  Patient tolerated treatment well;Patient limited by pain    Behavior During Therapy  [facility] for tasks assessed/performed       History reviewed. No pertinent past medical history.  History reviewed. No pertinent surgical history.  There were no vitals filed for this visit.   Subjective Assessment - 03/22/20 1155    Subjective  Pt reports intense low back pain and muscle spasm in buttocks on L side for the past month w/o known MOI. Pain worse with standing and walking. Some relief with meds but pain returns when meds wear off. Has tried some exercises but seems more painful with these.    Limitations  Standing;Walking;House hold activities    How long can you stand comfortably?  1-2 minutes    How long can you walk comfortably?  1-2 minutes    Diagnostic tests  Lumbar x-ray 02/21/20: No fracture or spondylolisthesis is noted. 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 Mild anterior osteophyte formation isnoted at L1-2.    Patient Stated Goals  "for pain to be more manageable and strengthen my back so this won't happen again"    Currently in Pain?  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  No    Is the patient reluctant to leave their home because of a fear of falling?   No      Home Environment   Living Environment  Private residence    Living Arrangements  Spouse/significant other    Type of [place] to enter    Entrance Stairs-Number of Steps  4-5    Entrance Stairs-Rails  Right    Home Layout  Two level;Bed/bath upstairs      Prior Function   Level of Independence  Independent    Vocation  Full time employment    Therapist, occupational at [employer]  time with family, no regular exercise      Cognition   Overall Cognitive Status  Within Functional Limits for tasks assessed      Observation/Other Assessments   Focus on Therapeutic Outcomes (FOTO)   Lumbar - 45% (55% limitation); Predicted 65% (35% limitation)      ROM / Strength   AROM / PROM / Strength  AROM;Strength      AROM   AROM Assessment Site  Lumbar    Lumbar Flexion  hands to ankles - tighttness in legs    Lumbar Extension  25% limted - increased pain    Lumbar - Right Side  Bend  hand to mid shin    Lumbar - Left Side Bend  hand to fibular head    Lumbar - Right Rotation  WFL    Lumbar - Left Rotation  25% limited      Strength   Strength Assessment Site  Hip;Knee;Ankle    Right/Left Hip  Right;Left    Right Hip Flexion  5/5    Right Hip Extension  4+/5    Right Hip External Rotation   4+/5    Right Hip Internal Rotation  5/5    Right Hip ABduction  4-/5    Right Hip ADduction  4+/5    Left Hip Flexion  4+/5    Left Hip Extension  4/5    Left Hip External Rotation  4-/5    Left Hip Internal Rotation  4/5    Left Hip ABduction  4/5    Left Hip ADduction  4/5    Right/Left Knee  Right;Left    Right Knee Flexion  5/5    Right Knee Extension  5/5    Left Knee Flexion  4+/5    Left Knee Extension  4+/5    Right/Left Ankle  Right;Left    Right Ankle Dorsiflexion  5/5    Left Ankle Dorsiflexion  5/5      Palpation   Spinal mobility  hypomobile lumbar spine esp L4    Palpation comment  increased muscle tension in L lumbar paraspinals & upper glutes; ttp over L SIJ and upper glutes      Special Tests    Special Tests  Lumbar    Lumbar Tests  Slump Test;Straight Leg Raise      Slump test   Findings  Positive    Side  Left      Straight Leg Raise   Findings  Positive    Side   Left                Objective measurements completed on examination: See above findings.      [facility] Adult PT Treatment/Exercise - 03/22/20 1152      Exercises   Exercises  Lumbar      Lumbar Exercises: Stretches   Passive Hamstring Stretch  Left;30 seconds;2 reps    Passive Hamstring Stretch Limitations  seated hip hinge - cues to avoid rounding back    Single Knee to Chest Stretch  Left;30 seconds;1 rep    Single Knee to Chest Stretch Limitations  deferred from HEP d/t increased pain    Lower Trunk Rotation  10 seconds;2 reps    Standing Extension Limitations  increased pain    Prone on Elbows Stretch Limitations  increased pain    Piriformis Stretch   Left;30 seconds;2 reps   each position   Piriformis Stretch Limitations  supine & seated KTOS    Figure 4 Stretch  30 seconds;1 rep;Supine;Seated;With overpressure    Figure 4 Stretch Limitations  L supine figure 4 to chest with both legs & L LE only - poorly tolerated; side sitting hip hinge - limited tolerance    Other Lumbar Stretch Exercise  seated L sciatic nerve glide x 10      Lumbar Exercises: Supine   Pelvic Tilt  10 reps;5 seconds             PT Education - 03/22/20 1255    Education Details  PT eval findings, anticipated POC and initial HEP    Person(s) Educated  Patient    Methods  Explanation;Demonstration;Verbal cues;Tactile cues;Handout    Comprehension  Verbalized understanding;Returned demonstration;Verbal cues required;Tactile cues required;Need further instruction       PT Short Term Goals - 03/22/20 1253      PT SHORT TERM GOAL #1   Title  Patient will be independent with initial HEP    Status  New    Target Date  04/05/20      PT SHORT TERM GOAL #2   Title  Patient will verbalize/demonstrate good awareness of neutral spine posture and proper body mechanics for daily tasks    Status  New    Target Date  04/12/20        PT Long Term Goals - 03/22/20 1255      PT LONG TERM GOAL #1   Title  Patient will be independent with ongoing/advanced HEP    Status  New    Target Date  05/03/20      PT LONG TERM GOAL #2   Title  Patient to demonstrate ability to achieve and maintain good spinal alignment/posturing    Status  New    Target Date  05/03/20      PT LONG TERM GOAL #3   Title  Patient to improve lumbar AROM to WNL without pain provocation    Status  New    Target Date  05/03/20      PT LONG TERM GOAL #4   Title  Patient will demonstrate improved B proximal LE strength to >/= 4+ to 5/5 for improved stability and ease of mobility    Status  New    Target Date  05/03/20      PT LONG TERM GOAL #5   Title  Patient will improve standing and/or  walking tolerance to >/= 1 hour w/o pain interference to allow resumption of normal daily activities    Status  New    Target Date  05/03/20      PT LONG TERM GOAL #6   Title  Patient to report ability to perform ADLs, household and work-related tasks without increased pain    Status  New    Target Date  05/03/20  Plan - 03/22/20 1255    Clinical Impression Statement  Reginald Bush is a 39 y/o male who presents to OP PT for acute L sided LBP with L sciatica of ~1 month duration without known MOI. Pain worse with standing and walking, better when sitting, with some temporary relief from meds but pain returning as meds wear off. 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 Deficits include low back pain localized near L SIJ with radicular pain into L buttock extending down posterior/lateral leg to foot, positive SLR and slump test on L, mildly limited trunk/lumbar ROM, spinal hypomobility throughout lumbar spine, decreased proximal LE muscle flexibility, and decreased core and proximal L LE strength. Reginald Bush will benefit from skilled PT services to address above impairments and allow for performance of normal daily activities with decreased pain interference.    Personal Factors and Comorbidities  Fitness;Past/Current Experience    Examination-Activity Limitations  Bend;Caring for Others;Carry;Lift;Locomotion Level;Squat;Stairs;Stand    Examination-Participation Restrictions  Cleaning;Community Activity;Interpersonal Relationship;Laundry;Meal Prep;Shop;Yard Work    Stability/Clinical Decision Making  Stable/Uncomplicated    Clinical Decision Making  Low    Rehab Potential  Good    PT Frequency  2x / week    PT Duration  6 weeks    PT Treatment/Interventions  ADLs/Self Care Home  Management;Cryotherapy;Electrical Stimulation;Iontophoresis 4mg /ml Dexamethasone;Moist Heat;Traction;Ultrasound;Functional mobility training;Therapeutic activities;Therapeutic exercise;Balance training;Neuromuscular re-education;Patient/family education;Manual techniques;Passive range of motion;Dry needling;Taping;Spinal Manipulations;Joint Manipulations    PT Next Visit Plan  Review initial HEP; progress lumbopelvic flexibilty and strengthening as tolerated; manual therapy including spinal mobs and STM/MFR as indicated with possible DN; modalities PRN    PT Home Exercise Plan  03/22/20 - supine & seated piriformis stretches, pelvic tilts, LTR, seated HS stretch & sciatic nerve glide    Consulted and Agree with Plan of Care  Patient       Patient will benefit from skilled therapeutic intervention in order to improve the following deficits and impairments:  Abnormal gait, Decreased activity tolerance, Decreased endurance, Decreased knowledge of precautions, Decreased mobility, Decreased range of motion, Decreased safety awareness, Decreased strength, Difficulty walking, Hypomobility, Increased fascial restricitons, Increased muscle spasms, Impaired perceived functional ability, Impaired flexibility, Improper body mechanics, Postural dysfunction, Pain  Visit Diagnosis: Acute left-sided low back pain with left-sided sciatica  Muscle weakness (generalized)  Abnormal posture  Other symptoms and signs involving the musculoskeletal system     Problem List Patient Active Problem List   Diagnosis Date Noted  . Acute left-sided low back pain with left-sided sciatica 02/21/2020    02/23/2020, PT, MPT 03/22/2020, 1:53 PM  [facility] [address]  [address] [place], [place], [place] Phone: [phone]   Fax:  [phone]  Name: Reginald Bush MRN: [id number] [id number] Date of Birth: 1981-08-05

## 2020-03-22 NOTE — Patient Instructions (Signed)
    Home exercise program created by [name], PT.  For questions, please contact [name] via phone at [phone] or email at [name]  [facility] [place] [address]  [address] [place], [place], [address] Phone: [phone]   Fax:  [phone]    

## 2020-03-28 ENCOUNTER — Ambulatory Visit: Payer: 59 | Admitting: Physical Therapy

## 2020-03-31 ENCOUNTER — Other Ambulatory Visit: Payer: Self-pay

## 2020-03-31 ENCOUNTER — Ambulatory Visit: Payer: 59

## 2020-03-31 DIAGNOSIS — R29898 Other symptoms and signs involving the musculoskeletal system: Secondary | ICD-10-CM

## 2020-03-31 DIAGNOSIS — M5442 Lumbago with sciatica, left side: Secondary | ICD-10-CM

## 2020-03-31 DIAGNOSIS — M6281 Muscle weakness (generalized): Secondary | ICD-10-CM | POA: Diagnosis not present

## 2020-03-31 DIAGNOSIS — R293 Abnormal posture: Secondary | ICD-10-CM

## 2020-03-31 NOTE — Therapy (Signed)
[facility] [address]  [address] [place], [place], [address] Phone: [phone]   Fax:  [phone]  Physical Therapy Treatment  Patient Details  Name: Reginald Bush MRN: [id number] Date of Birth: 09/07/1981 Referring Provider (PT): Clare Gandy, MD   Encounter Date: 03/31/2020  PT End of Session - 03/31/20 1157    Visit Number  2    Number of Visits  12    Date for PT Re-Evaluation  05/03/20    Authorization Type  [facility]    PT Start Time  1110    PT Stop Time  1155    PT Time Calculation (min)  45 min    Activity Tolerance  Patient tolerated treatment well;Patient limited by pain    Behavior During Therapy  [facility] for tasks assessed/performed       No past medical history on file.  No past surgical history on file.  There were no vitals filed for this visit.  Subjective Assessment - 03/31/20 1112    Subjective  Pt reports his pain has improved to a 5/10 from a 7-8/10. Sleeping on R side is helping with L-sided LBP and medication. Standing continues to cause referral of pain all the way down L leg to ankle, stopping just above foot. Exercises are helpful, but he still feels like a little "nerve pinch".    Patient Stated Goals  "for pain to be more manageable and strengthen my back so this won't happen again"    Currently in Pain?  No/denies         [facility] PT Assessment - 03/31/20 0001      AROM   AROM Assessment Site  Lumbar                   [facility] Adult PT Treatment/Exercise - 03/31/20 0001      Lumbar Exercises: Stretches   Prone on Elbows Stretch Limitations  tolerated with PAs to left lumbar spine then able to hold for 3 minutes     Other Lumbar Stretch Exercise  L to R sideglides at wall with pillow b/t door and shoulder, verbal and tactile cues for upright posture and neutral pelvic tilt to maximize form and optimize exercise     Other Lumbar Stretch Exercise  R S/L book openers x 15 with  self approximation of L knee to stabilize lumbar spine       Manual Therapy   Manual Therapy  Joint mobilization    Manual therapy comments  stiffness to L lumbar spine    Joint Mobilization  Grade III-IV L lumbar gapping, L lumbar UPAs grade II-III, mid thoracic CPAs grade III-IV             PT Education - 03/31/20 1201    Education Details  added L thoracic rotation and standing L to R side glides    Person(s) Educated  Patient    Methods  Explanation;Handout    Comprehension  Verbalized understanding       PT Short Term Goals - 03/22/20 1253      PT SHORT TERM GOAL #1   Title  Patient will be independent with initial HEP    Status  New    Target Date  04/05/20      PT SHORT TERM GOAL #2   Title  Patient will verbalize/demonstrate good awareness of neutral spine posture and proper body mechanics for daily tasks    Status  New    Target  Date  04/12/20        PT Long Term Goals - 03/22/20 1255      PT LONG TERM GOAL #1   Title  Patient will be independent with ongoing/advanced HEP    Status  New    Target Date  05/03/20      PT LONG TERM GOAL #2   Title  Patient to demonstrate ability to achieve and maintain good spinal alignment/posturing    Status  New    Target Date  05/03/20      PT LONG TERM GOAL #3   Title  Patient to improve lumbar AROM to WNL without pain provocation    Status  New    Target Date  05/03/20      PT LONG TERM GOAL #4   Title  Patient will demonstrate improved B proximal LE strength to >/= 4+ to 5/5 for improved stability and ease of mobility    Status  New    Target Date  05/03/20      PT LONG TERM GOAL #5   Title  Patient will improve standing and/or walking tolerance to >/= 1 hour w/o pain interference to allow resumption of normal daily activities    Status  New    Target Date  05/03/20      PT LONG TERM GOAL #6   Title  Patient to report ability to perform ADLs, household and work-related tasks without increased pain     Status  New    Target Date  05/03/20            Plan - 03/31/20 1158    Clinical Impression Statement  Pt presents with improvement in pain and no c/o pain in sitting upon initiation of session. Pt has R lateral shift, likely to decrease pressure to left low back, but some R-sided mid-upper thoracic scoliosis causing L shoulder depression confusing his presentation slightly. He responded well to standing lateral glides, R S/L thoracic rotation, prone lying and to prone on elbows with L UPAs to lumbar spine then able to tolerate on his own for a few minutes. Pt will benefit from progressions of shift to neutral followed by extension-based exercise before presenting flexion again.    Personal Factors and Comorbidities  Fitness;Past/Current Experience    Examination-Activity Limitations  Bend;Caring for Others;Carry;Lift;Locomotion Level;Squat;Stairs;Stand    Examination-Participation Restrictions  Cleaning;Community Activity;Interpersonal Relationship;Laundry;Meal Prep;Shop;Yard Work    Stability/Clinical Decision Making  Stable/Uncomplicated    Rehab Potential  Good    PT Frequency  2x / week    PT Duration  6 weeks    PT Treatment/Interventions  ADLs/Self Care Home Management;Cryotherapy;Electrical Stimulation;Iontophoresis 4mg /ml Dexamethasone;Moist Heat;Traction;Ultrasound;Functional mobility training;Therapeutic activities;Therapeutic exercise;Balance training;Neuromuscular re-education;Patient/family education;Manual techniques;Passive range of motion;Dry needling;Taping;Spinal Manipulations;Joint Manipulations    PT Next Visit Plan  Progress lateral shift to neutral and extension as able, isometric inner core    PT Home Exercise Plan  03/22/20 - supine & seated piriformis stretches, pelvic tilts, LTR, seated HS stretch & sciatic nerve glide    Consulted and Agree with Plan of Care  Patient       Patient will benefit from skilled therapeutic intervention in order to improve the following  deficits and impairments:  Abnormal gait, Decreased activity tolerance, Decreased endurance, Decreased knowledge of precautions, Decreased mobility, Decreased range of motion, Decreased safety awareness, Decreased strength, Difficulty walking, Hypomobility, Increased fascial restricitons, Increased muscle spasms, Impaired perceived functional ability, Impaired flexibility, Improper body mechanics, Postural dysfunction, Pain  Visit Diagnosis: Acute left-sided  low back pain with left-sided sciatica  Muscle weakness (generalized)  Abnormal posture  Other symptoms and signs involving the musculoskeletal system     Problem List Patient Active Problem List   Diagnosis Date Noted  . Acute left-sided low back pain with left-sided sciatica 02/21/2020    Marcelline Mates, PT, DPT 03/31/2020, 12:04 PM  [facility] [address]  [address] [place], [place], [address] Phone: [phone]   Fax:  [phone]  Name: Riki Gehring MRN: [id number] Date of Birth: Apr 14, 1981

## 2020-04-04 ENCOUNTER — Other Ambulatory Visit: Payer: Self-pay

## 2020-04-04 ENCOUNTER — Ambulatory Visit: Payer: 59

## 2020-04-04 DIAGNOSIS — M5442 Lumbago with sciatica, left side: Secondary | ICD-10-CM | POA: Diagnosis not present

## 2020-04-04 DIAGNOSIS — R29898 Other symptoms and signs involving the musculoskeletal system: Secondary | ICD-10-CM

## 2020-04-04 DIAGNOSIS — M6281 Muscle weakness (generalized): Secondary | ICD-10-CM | POA: Diagnosis not present

## 2020-04-04 DIAGNOSIS — R293 Abnormal posture: Secondary | ICD-10-CM | POA: Diagnosis not present

## 2020-04-04 NOTE — Therapy (Signed)
[facility] [address]  [address] [place], [place], [address] Phone: [phone]   Fax:  [phone]  Physical Therapy Treatment  Patient Details  Name: Reginald Bush MRN: [id number] Date of Birth: Mar 02, 1981 Referring Provider (PT): Clare Gandy, MD   Encounter Date: 04/04/2020  PT End of Session - 04/04/20 1106    Visit Number  3    Number of Visits  12    Date for PT Re-Evaluation  05/03/20    Authorization Type  [facility]    PT Start Time  1018    PT Stop Time  1103    PT Time Calculation (min)  45 min    Activity Tolerance  Patient tolerated treatment well;No increased pain    Behavior During Therapy  [facility] for tasks assessed/performed       No past medical history on file.  No past surgical history on file.  There were no vitals filed for this visit.  Subjective Assessment - 04/04/20 1021    Subjective  Pt reports his back is feeling a little bit better and he is trying to walk straighter. His pain in his L hip is a little better than it was, but it gets worse with overuse. It is manageable pain overall, but still there. He was doing yardwork yesterday and overexerted himself, getting a spasm after cutting weeds. Spasm was in his left side, and it happened after he took a shower and sat down to relax.    Patient Stated Goals  "for pain to be more manageable and strengthen my back so this won't happen again"    Currently in Pain?  No/denies                       [facility] Adult PT Treatment/Exercise - 04/04/20 0001      Lumbar Exercises: Stretches   Prone on Elbows Stretch  60 seconds    Prone on Elbows Stretch Limitations  x 2 with intermittent L hip PA     Press Ups  15 reps    Press Ups Limitations  with lumbar PA grade IV during press-up      Lumbar Exercises: Standing   Other Standing Lumbar Exercises  Lumbar extension over raised plinth with BUE support 1 x 10      Lumbar Exercises:  Sidelying   Other Sidelying Lumbar Exercises  L Book openers in R S/L x 25      Manual Therapy   Manual Therapy  Joint mobilization    Manual therapy comments  attempted standing L to R side glides maually to correct shift at start of session, but reported nerve sensation down LLE    Joint Mobilization  L UPAs L3-5 grade II-III, T/S UPA's/CPA's T3-12, L rib rotation facilitation in R S/L with thoracic rotation book openers               PT Short Term Goals - 03/22/20 1253      PT SHORT TERM GOAL #1   Title  Patient will be independent with initial HEP    Status  New    Target Date  04/05/20      PT SHORT TERM GOAL #2   Title  Patient will verbalize/demonstrate good awareness of neutral spine posture and proper body mechanics for daily tasks    Status  New    Target Date  04/12/20        PT Long Term Goals -  03/22/20 1255      PT LONG TERM GOAL #1   Title  Patient will be independent with ongoing/advanced HEP    Status  New    Target Date  05/03/20      PT LONG TERM GOAL #2   Title  Patient to demonstrate ability to achieve and maintain good spinal alignment/posturing    Status  New    Target Date  05/03/20      PT LONG TERM GOAL #3   Title  Patient to improve lumbar AROM to WNL without pain provocation    Status  New    Target Date  05/03/20      PT LONG TERM GOAL #4   Title  Patient will demonstrate improved B proximal LE strength to >/= 4+ to 5/5 for improved stability and ease of mobility    Status  New    Target Date  05/03/20      PT LONG TERM GOAL #5   Title  Patient will improve standing and/or walking tolerance to >/= 1 hour w/o pain interference to allow resumption of normal daily activities    Status  New    Target Date  05/03/20      PT LONG TERM GOAL #6   Title  Patient to report ability to perform ADLs, household and work-related tasks without increased pain    Status  New    Target Date  05/03/20            Plan - 04/04/20 1107     Clinical Impression Statement  Pt presents with gradual improvement in L LBP with reduced peripheralization to LLE noted. He did have muscle spasm last night that seems to have slightly set him back. Poorly tolerated manual L to R sideglide initially with significant improvement in reducing "pinch" with L thoracic rotation in R S/L with hip stabilization. Pt able to progress to partial prone press ups at end of session with lower lumbar PA, as well as standing lumbar extension over mat table. Educated to continue to try those exercises, as he is able, as well as lateral glide at wall.    Personal Factors and Comorbidities  Fitness;Past/Current Experience    Examination-Activity Limitations  Bend;Caring for Others;Carry;Lift;Locomotion Level;Squat;Stairs;Stand    Examination-Participation Restrictions  Cleaning;Community Activity;Interpersonal Relationship;Laundry;Meal Prep;Shop;Yard Work    Stability/Clinical Decision Making  Stable/Uncomplicated    Rehab Potential  Good    PT Frequency  2x / week    PT Duration  6 weeks    PT Treatment/Interventions  ADLs/Self Care Home Management;Cryotherapy;Electrical Stimulation;Iontophoresis 4mg /ml Dexamethasone;Moist Heat;Traction;Ultrasound;Functional mobility training;Therapeutic activities;Therapeutic exercise;Balance training;Neuromuscular re-education;Patient/family education;Manual techniques;Passive range of motion;Dry needling;Taping;Spinal Manipulations;Joint Manipulations    PT Next Visit Plan  Progress lateral shift to neutral and extension as able, isometric inner core    PT Home Exercise Plan  03/22/20 - supine & seated piriformis stretches, pelvic tilts, LTR, seated HS stretch & sciatic nerve glide    Consulted and Agree with Plan of Care  Patient       Patient will benefit from skilled therapeutic intervention in order to improve the following deficits and impairments:  Abnormal gait, Decreased activity tolerance, Decreased endurance, Decreased  knowledge of precautions, Decreased mobility, Decreased range of motion, Decreased safety awareness, Decreased strength, Difficulty walking, Hypomobility, Increased fascial restricitons, Increased muscle spasms, Impaired perceived functional ability, Impaired flexibility, Improper body mechanics, Postural dysfunction, Pain  Visit Diagnosis: Acute left-sided low back pain with left-sided sciatica  Muscle weakness (generalized)  Abnormal posture  Other symptoms and signs involving the musculoskeletal system     Problem List Patient Active Problem List   Diagnosis Date Noted  . Acute left-sided low back pain with left-sided sciatica 02/21/2020    Marcelline Mates, PT, DPT 04/04/2020, 1:07 PM  [facility] [address]  [address] [place], [place], [address] Phone: [phone]   Fax:  [phone]  Name: Reginald Bush MRN: [id number] Date of Birth: October 24, 1981

## 2020-04-07 ENCOUNTER — Ambulatory Visit: Payer: 59 | Admitting: Physical Therapy

## 2020-04-11 ENCOUNTER — Other Ambulatory Visit: Payer: Self-pay

## 2020-04-11 ENCOUNTER — Ambulatory Visit: Payer: 59 | Attending: Family Medicine

## 2020-04-11 DIAGNOSIS — M5442 Lumbago with sciatica, left side: Secondary | ICD-10-CM | POA: Diagnosis not present

## 2020-04-11 DIAGNOSIS — R293 Abnormal posture: Secondary | ICD-10-CM

## 2020-04-11 DIAGNOSIS — R29898 Other symptoms and signs involving the musculoskeletal system: Secondary | ICD-10-CM | POA: Diagnosis not present

## 2020-04-11 DIAGNOSIS — M6281 Muscle weakness (generalized): Secondary | ICD-10-CM | POA: Diagnosis not present

## 2020-04-11 NOTE — Patient Instructions (Signed)

## 2020-04-11 NOTE — Therapy (Signed)
[facility] [address]  [place], [place], [address] Phone: [phone]   Fax:  [phone]  Physical Therapy Treatment  Patient Details  Name: Reginald Bush MRN: [id number] Date of Birth: [birth year] Referring Provider (PT): Clearance Coots, MD   Encounter Date: 04/11/2020  PT End of Session - 04/11/20 1024    Visit Number  4    Number of Visits  12    Date for PT Re-Evaluation  05/03/20    Authorization Type  [facility]    PT Start Time  6606   pt. arrived late to session   PT Stop Time  1110    PT Time Calculation (min)  49 min    Activity Tolerance  Patient tolerated treatment well;No increased pain    Behavior During Therapy  [facility] for tasks assessed/performed       No past medical history on file.  No past surgical history on file.  There were no vitals filed for this visit.  Subjective Assessment - 04/11/20 1027    Subjective  Pt. reporting increased L LE pain after 2 back to back shifts.    Diagnostic tests  Lumbar x-ray 02/21/20: No fracture or spondylolisthesis is noted. 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 Mild anterior osteophyte formation isnoted at L1-2.    Patient Stated Goals  "for pain to be more manageable and strengthen my back so this won't happen again"    Currently in Pain?  Yes    Pain Score  2     Pain Location  Back    Pain Orientation  Left;Lower    Pain Descriptors / Indicators  Throbbing    Pain Type  Acute pain    Pain Radiating Towards  mild down L LE lateral to ankle    Pain Onset  More than a month ago    Pain Frequency  Constant    Aggravating Factors   prolonged work shifts    Multiple Pain Sites  No                       [facility] Adult PT Treatment/Exercise - 04/11/20 0001      Self-Care   Self-Care  Other Self-Care Comments    Other Self-Care Comments   Instruction in proper posture and body mechanics for daily activities to reduce lumbar  strain       Lumbar Exercises: Stretches   Passive Hamstring Stretch  Left;30 seconds;2 reps    Passive Hamstring Stretch Limitations  supine with strap; oppo LE bend    Single Knee to Chest Stretch  Left;Right;1 rep;30 seconds    Lower Trunk Rotation Limitations  5" x 10 rpes    cued to avoid painful end range    Prone on Elbows Stretch  60 seconds    Press Ups  3 reps;10 reps    Press Ups Limitations  partial ROM - mild increase in LBP with resolution with rest       Lumbar Exercises: Aerobic   Recumbent Bike  Lvl 2, 6 min        Modalities   Modalities  Electrical Stimulation;Moist Heat      Moist Heat Therapy   Number Minutes Moist Heat  10 Minutes    Moist Heat Location  Lumbar Spine      Electrical Stimulation   Electrical Stimulation Location  L -sided lumbar spine     Electrical Stimulation Action  IFC  Electrical Stimulation Parameters  80-150Hz , intensity to pt. tolerance, 10'    Electrical Stimulation Goals  Pain             PT Education - 04/11/20 1357    Education Details  educational handout for posture and body mechanics with daily activities    Person(s) Educated  Patient    Methods  Explanation;Demonstration;Verbal cues;Handout    Comprehension  Verbalized understanding;Returned demonstration;Verbal cues required       PT Short Term Goals - 04/11/20 1024      PT SHORT TERM GOAL #1   Title  Patient will be independent with initial HEP    Status  Achieved    Target Date  04/05/20      PT SHORT TERM GOAL #2   Title  Patient will verbalize/demonstrate good awareness of neutral spine posture and proper body mechanics for daily tasks    Status  On-going    Target Date  04/12/20        PT Long Term Goals - 04/11/20 1024      PT LONG TERM GOAL #1   Title  Patient will be independent with ongoing/advanced HEP    Status  On-going      PT LONG TERM GOAL #2   Title  Patient to demonstrate ability to achieve and maintain good spinal  alignment/posturing    Status  On-going      PT LONG TERM GOAL #3   Title  Patient to improve lumbar AROM to WNL without pain provocation    Status  On-going      PT LONG TERM GOAL #4   Title  Patient will demonstrate improved B proximal LE strength to >/= 4+ to 5/5 for improved stability and ease of mobility    Status  On-going      PT LONG TERM GOAL #5   Title  Patient will improve standing and/or walking tolerance to >/= 1 hour w/o pain interference to allow resumption of normal daily activities    Status  On-going      PT LONG TERM GOAL #6   Title  Patient to report ability to perform ADLs, household and work-related tasks without increased pain    Status  On-going            Plan - 04/11/20 1037    Clinical Impression Statement  Pt. noting consistent performance of HEP and denies questions.  STG #1 achieved.  Reports increased LBP/L LE pain after working back-to-back work shifts a few days ago.  Instructed pt. in proper posture and body mechanics with daily activities as to reduce lumbar strain.  Pt. reporting awareness of need for changing positions frequently at work cycling between standing and sitting.  Progressed prone extension-based lumbar ROM activities to half prone press ups and continued glute stretching with pt. noting some relief with these activities.  Notes sitting provides him relief and standing shifting weight over L LE increases his pain.  Ended visit with some discomfort in L lower back thus applied moist heat/E-stim. for pain relief with good relief noted.  Will plan to progress proximal hip/lumbar strengthening activities per pt. tolerance in coming sessions.    Rehab Potential  Good    PT Frequency  2x / week    PT Treatment/Interventions  ADLs/Self Care Home Management;Cryotherapy;Electrical Stimulation;Iontophoresis 4mg /ml Dexamethasone;Moist Heat;Traction;Ultrasound;Functional mobility training;Therapeutic activities;Therapeutic exercise;Balance  training;Neuromuscular re-education;Patient/family education;Manual techniques;Passive range of motion;Dry needling;Taping;Spinal Manipulations;Joint Manipulations    PT Next Visit Plan  Progress lateral shift to  neutral and extension as able, isometric inner core    PT Home Exercise Plan  03/22/20 - supine & seated piriformis stretches, pelvic tilts, LTR, seated HS stretch & sciatic nerve glide    Consulted and Agree with Plan of Care  Patient       Patient will benefit from skilled therapeutic intervention in order to improve the following deficits and impairments:  Abnormal gait, Decreased activity tolerance, Decreased endurance, Decreased knowledge of precautions, Decreased mobility, Decreased range of motion, Decreased safety awareness, Decreased strength, Difficulty walking, Hypomobility, Increased fascial restricitons, Increased muscle spasms, Impaired perceived functional ability, Impaired flexibility, Improper body mechanics, Postural dysfunction, Pain  Visit Diagnosis: Acute left-sided low back pain with left-sided sciatica  Muscle weakness (generalized)  Abnormal posture  Other symptoms and signs involving the musculoskeletal system     Problem List Patient Active Problem List   Diagnosis Date Noted  . Acute left-sided low back pain with left-sided sciatica 02/21/2020    Kermit Balo, PTA 04/11/20 2:17 PM   [facility] [address]  [address] [place], [place], [address] Phone: [phone]   Fax:  [phone]  Name: Cameren Earnest MRN: [id number] Date of Birth: [birth year]

## 2020-04-14 ENCOUNTER — Ambulatory Visit: Payer: 59 | Admitting: Physical Therapy

## 2020-04-14 ENCOUNTER — Other Ambulatory Visit: Payer: Self-pay

## 2020-04-14 DIAGNOSIS — R29898 Other symptoms and signs involving the musculoskeletal system: Secondary | ICD-10-CM

## 2020-04-14 DIAGNOSIS — M5442 Lumbago with sciatica, left side: Secondary | ICD-10-CM | POA: Diagnosis not present

## 2020-04-14 DIAGNOSIS — M6281 Muscle weakness (generalized): Secondary | ICD-10-CM

## 2020-04-14 DIAGNOSIS — R293 Abnormal posture: Secondary | ICD-10-CM

## 2020-04-14 NOTE — Therapy (Signed)
[facility] [address]  [place], [place], [address] Phone: [phone]   Fax:  [phone]  Physical Therapy Treatment  Patient Details  Name: Reginald Bush MRN: [id number] Date of Birth: [birth year] Referring Provider (PT): Clearance Coots, MD   Encounter Date: 04/14/2020  PT End of Session - 04/14/20 1112    Visit Number  5    Number of Visits  12    Date for PT Re-Evaluation  05/03/20    Authorization Type  [facility]    PT Start Time  1112   Pt arrived late   PT Stop Time  1214    PT Time Calculation (min)  62 min    Activity Tolerance  Patient tolerated treatment well    Behavior During Therapy  [facility] for tasks assessed/performed       No past medical history on file.  No past surgical history on file.  There were no vitals filed for this visit.  Subjective Assessment - 04/14/20 1116    Subjective  Pt reports back pain remains variable, even after doing HEP. Prolonged standing still most common trigger. Only taking pain meds one a day but pain will get worse if he skips pain meds.    Diagnostic tests  Lumbar x-ray 02/21/20: No fracture or spondylolisthesis is noted. 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 Mild anterior osteophyte formation isnoted at L1-2.    Patient Stated Goals  "for pain to be more manageable and strengthen my back so this won't happen again"    Currently in Pain?  No/denies    Pain Score  0-No pain   2-3/10 with standing, up to 8/10 at worst   Pain Location  Back    Pain Orientation  Lower;Left    Pain Descriptors / Indicators  Shooting    Pain Radiating Towards  down lateral L LE to ankle    Pain Frequency  Intermittent                       [facility] Adult PT Treatment/Exercise - 04/14/20 1112      Lumbar Exercises: Stretches   Prone on Elbows Stretch  60 seconds;2 reps    Prone on Elbows Stretch Limitations  x 2 with intermittent L hip PA     Press Ups   15 reps    Press Ups Limitations  with lumbar PA grade IV during press-up      Lumbar Exercises: Aerobic   Recumbent Bike  L2 x 6 min      Lumbar Exercises: Supine   Pelvic Tilt  10 reps;5 seconds    Bent Knee Raise  10 reps;3 seconds    Bent Knee Raise Limitations  brace marching    Bridge  10 reps;5 seconds      Lumbar Exercises: Quadruped   Madcat/Old Horse  10 reps    Madcat/Old Horse Limitations  repeated VC/TC for corrent movement pattern    Straight Leg Raise  10 reps;3 seconds    Straight Leg Raises Limitations  cues to engage abdominals to stablize spine      Modalities   Modalities  Electrical Stimulation;Moist Heat      Moist Heat Therapy   Number Minutes Moist Heat  15 Minutes    Moist Heat Location  Lumbar Spine      Electrical Stimulation   Electrical Stimulation Location  B Lumbar parapsinals/glutes    Electrical Stimulation Action  IFC  Electrical Stimulation Parameters  80-'150Hz'$ , intensity to pt tolerance x 15'    Electrical Stimulation Goals  Pain      Manual Therapy   Manual Therapy  Joint mobilization;Manual Traction    Joint Mobilization  L UPAs L3-5 grade II-III, T/S UPA's/CPA's T3-12    Manual Traction  attempted manual longitudinal distraction/traction of L LE but pt reporting increased radicular pain             PT Education - 04/14/20 1212    Education Details  HEP update - McKenzie extension progression; info on home TENS unit options    Person(s) Educated  Patient    Methods  Explanation;Demonstration;Handout    Comprehension  Verbalized understanding;Returned demonstration;Need further instruction       PT Short Term Goals - 04/14/20 1124      PT SHORT TERM GOAL #1   Title  Patient will be independent with initial HEP    Status  Achieved   04/11/20     PT SHORT TERM GOAL #2   Title  Patient will verbalize/demonstrate good awareness of neutral spine posture and proper body mechanics for daily tasks    Status  Partially Met    04/14/20 - pt reporting improved awareness following education last visit, but noted to stand with knees hyperextended with hips thrust forward creating anterior pelvic tilt & excessive lumbar lordosis leading to increased LBP and L radicular pain   Target Date  04/12/20        PT Long Term Goals - 04/14/20 1124      PT LONG TERM GOAL #1   Title  Patient will be independent with ongoing/advanced HEP    Status  On-going    Target Date  05/03/20      PT LONG TERM GOAL #2   Title  Patient to demonstrate ability to achieve and maintain good spinal alignment/posturing    Status  On-going    Target Date  05/03/20      PT LONG TERM GOAL #3   Title  Patient to improve lumbar AROM to WNL without pain provocation    Status  On-going    Target Date  05/03/20      PT LONG TERM GOAL #4   Title  Patient will demonstrate improved B proximal LE strength to >/= 4+ to 5/5 for improved stability and ease of mobility    Status  On-going    Target Date  05/03/20      PT LONG TERM GOAL #5   Title  Patient will improve standing and/or walking tolerance to >/= 1 hour w/o pain interference to allow resumption of normal daily activities    Status  On-going    Target Date  05/03/20      PT LONG TERM GOAL #6   Title  Patient to report ability to perform ADLs, household and work-related tasks without increased pain    Status  On-going    Target Date  05/03/20            Plan - 04/14/20 1214    Clinical Impression Statement  Reginald Bush reports pain has become more variable/intermittent but still most commonly triggered by prolonged standing. Pt noted to stand with knees hyperextended with hips thrust forward creating anterior pelvic tilt and excessive lumbar lordosis - when cued to neutralize spinal posture and lower extremity alignment, pt reports decrease in intensity of pain. Pt notes relief from POE/prone press-up type motions therefore provided education in McKenzie extension progression and  expected response while promoting increased vertebral mobility. Pt most ttp today with R UPAs at thoracolumbar junction but no ttp in surrounding musculature. Remaining exercises focusing on lumbopelvic strengthening and stabilization with good tolerance. Session concluded with estim and moist heat as pt noting benefit from this last session. Information provided on home TENS/IFC unit options with pt providing verbal consent to submit his information to [employer] to determine coverage for Flex-IT TENS unit.    Personal Factors and Comorbidities  Fitness;Past/Current Experience    Examination-Activity Limitations  Bend;Caring for Others;Carry;Lift;Locomotion Level;Squat;Stairs;Stand    Examination-Participation Restrictions  Cleaning;Community Activity;Interpersonal Relationship;Laundry;Meal Prep;Shop;Yard Work    Rehab Potential  Good    PT Frequency  2x / week    PT Duration  6 weeks    PT Treatment/Interventions  ADLs/Self Care Home Management;Cryotherapy;Electrical Stimulation;Iontophoresis '4mg'$ /ml Dexamethasone;Moist Heat;Traction;Ultrasound;Functional mobility training;Therapeutic activities;Therapeutic exercise;Balance training;Neuromuscular re-education;Patient/family education;Manual techniques;Passive range of motion;Dry needling;Taping;Spinal Manipulations;Joint Manipulations    PT Next Visit Plan  Progress lateral shift to neutral and extension as able, isometric inner core    PT Home Exercise Plan  03/22/20 - supine & seated piriformis stretches, pelvic tilts, LTR, seated HS stretch & sciatic nerve glide; 03/31/20 - lateral shift corrections, S/L open book; 04/14/20 - McKenzie extension progression    Consulted and Agree with Plan of Care  Patient       Patient will benefit from skilled therapeutic intervention in order to improve the following deficits and impairments:  Abnormal gait, Decreased activity tolerance, Decreased endurance, Decreased knowledge of precautions, Decreased mobility, Decreased  range of motion, Decreased safety awareness, Decreased strength, Difficulty walking, Hypomobility, Increased fascial restricitons, Increased muscle spasms, Impaired perceived functional ability, Impaired flexibility, Improper body mechanics, Postural dysfunction, Pain  Visit Diagnosis: Acute left-sided low back pain with left-sided sciatica  Muscle weakness (generalized)  Abnormal posture  Other symptoms and signs involving the musculoskeletal system     Problem List Patient Active Problem List   Diagnosis Date Noted  . Acute left-sided low back pain with left-sided sciatica 02/21/2020    Percival Spanish, PT, MPT 04/14/2020, 12:46 PM  [facility] [address]  [place], [place], [address] Phone: [phone]   Fax:  [phone]  Name: Reginald Bush MRN: [id number] Date of Birth: January 20, 1981

## 2020-04-14 NOTE — Patient Instructions (Addendum)
TENS UNIT  This is helpful for muscle pain and spasm.   Search and Purchase a TENS 7000 2nd edition at www.tenspros.com or www.amazon.com  (It should be less than $30)     TENS unit instructions:   Do not shower or bathe with the unit on  Turn the unit off before removing electrodes or batteries  If the electrodes lose stickiness add a drop of water to the electrodes after they are disconnected from the unit and place on plastic sheet. If you continued to have difficulty, call the TENS unit company to purchase more electrodes.  Do not apply lotion on the skin area prior to use. Make sure the skin is clean and dry as this will help prolong the life of the electrodes.  After use, always check skin for unusual red areas, rash or other skin difficulties. If there are any skin problems, does not apply electrodes to the same area.  Never remove the electrodes from the unit by pulling the wires.  Do not use the TENS unit or electrodes other than as directed.  Do not change electrode placement without consulting your therapist or physician.  Keep 2 fingers with between each electrode.    TENS stands for Transcutaneous Electrical Nerve Stimulation. In other words, electrical impulses are allowed to pass through the skin in order to excite a nerve.   Purpose and Use of TENS:  TENS is a method used to manage acute and chronic pain without the use of drugs. It has been effective in managing pain associated with surgery, sprains, strains, trauma, rheumatoid arthritis, and neuralgias. It is a non-addictive, low risk, and non-invasive technique used to control pain. It is not, by any means, a curative form of treatment.   How TENS Works:  Most TENS units are a small pocket-sized unit powered by one 9 volt battery. Attached to the outside of the unit are two lead wires where two pins and/or snaps connect on each wire. All units come with a set of four reusable pads or electrodes. These are placed  on the skin surrounding the area involved. By inserting the leads into  the pads, the electricity can pass from the unit making the circuit complete.  As the intensity is turned up slowly, the electrical current enters the body from the electrodes through the skin to the surrounding nerve fibers. This triggers the release of hormones from within the body. These hormones contain pain relievers. By increasing the circulation of these hormones, the person's pain may be lessened. It is also believed that the electrical stimulation itself helps to block the pain messages being sent to the brain, thus also decreasing the body's perception of pain.   Hazards:  TENS units are NOT to be used by patients with PACEMAKERS, DEFIBRILLATORS, DIABETIC PUMPS, PREGNANT WOMEN, and patients with SEIZURE DISORDERS.  TENS units are NOT to be used over the heart, throat, brain, or spinal cord.  One of the major side effects from the TENS unit may be skin irritation. Some people may develop a rash if they are sensitive to the materials used in the electrodes or the connecting wires.   Wear the unit for up to 30-45 minutes at a time, 3-4x/day as needed.   Avoid overuse due the body getting used to the stem making it not as effective over time.       Home exercise program created by Emmitt Matthews, PT.  For questions, please contact Trent Gabler via phone at [phone] or email at   [email or web address]  [facility] [place] [address]  [address] [place], [place], [address] Phone: [phone]   Fax:  [phone]    

## 2020-04-18 ENCOUNTER — Ambulatory Visit: Payer: 59

## 2020-04-21 ENCOUNTER — Ambulatory Visit: Payer: 59 | Admitting: Physical Therapy

## 2020-04-21 ENCOUNTER — Other Ambulatory Visit: Payer: Self-pay

## 2020-04-21 ENCOUNTER — Encounter: Payer: Self-pay | Admitting: Physical Therapy

## 2020-04-21 DIAGNOSIS — R293 Abnormal posture: Secondary | ICD-10-CM

## 2020-04-21 DIAGNOSIS — M6281 Muscle weakness (generalized): Secondary | ICD-10-CM

## 2020-04-21 DIAGNOSIS — R29898 Other symptoms and signs involving the musculoskeletal system: Secondary | ICD-10-CM | POA: Diagnosis not present

## 2020-04-21 DIAGNOSIS — M5442 Lumbago with sciatica, left side: Secondary | ICD-10-CM

## 2020-04-21 NOTE — Therapy (Signed)
[facility] [place] [address]  [place], [place], [address] Phone: [phone]   Fax:  [phone]  Physical Therapy Treatment  Patient Details  Name: Pearley Baranek MRN: [id number] Date of Birth: Mar 20, 1981 Referring Provider (PT): Clearance Coots, MD   Encounter Date: 04/21/2020  PT End of Session - 04/21/20 1109    Visit Number  6    Number of Visits  12    Date for PT Re-Evaluation  05/03/20    Authorization Type  [facility]    PT Start Time  1109   Pt arrived late   PT Stop Time  1205    PT Time Calculation (min)  56 min    Activity Tolerance  Patient tolerated treatment well    Behavior During Therapy  [facility] for tasks assessed/performed       History reviewed. No pertinent past medical history.  History reviewed. No pertinent surgical history.  There were no vitals filed for this visit.  Subjective Assessment - 04/21/20 1111    Subjective  Pt reports pain is getting a little better with exercises - about 20% - but still increases with prolonged standing or walking. Feels like he is still struggling to work on his posture. Notes most benefit from prone press-ups, both with posture and upper body strength.    Diagnostic tests  Lumbar x-ray 02/21/20: No fracture or spondylolisthesis is noted. 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 Mild anterior osteophyte formation isnoted at L1-2.    Patient Stated Goals  "for pain to be more manageable and strengthen my back so this won't happen again"    Currently in Pain?  No/denies                        [facility] Adult PT Treatment/Exercise - 04/21/20 1109      Lumbar Exercises: Stretches   Standing Side Bend  Left;30 seconds;2 reps    Standing Side Bend Limitations  quadratus lumborum stretch at wall    Other Lumbar Stretch Exercise  L to R sideglides at wall with pillow b/t door and shoulder, verbal and tactile cues for upright posture and neutral  pelvic tilt to maximize form and optimize exercise - better tolerated following QL stretch      Lumbar Exercises: Aerobic   Recumbent Bike  L2 x 6 min      Lumbar Exercises: Standing   Row  Both;10 reps;Strengthening;Theraband    Theraband Level (Row)  Level 3 (Green)    Row Limitations  staggered stance with cues for abd bracing and scap retraction    Shoulder Extension  Both;10 reps;Strengthening;Theraband    Theraband Level (Shoulder Extension)  Level 3 (Green)    Shoulder Extension Limitations  opp staggered stance with cues for abd bracing and scap retraction    Other Standing Lumbar Exercises  R/L green TB pallof  press x 10 - increased lateral trunk shift to R with R side to anchor      Lumbar Exercises: Prone   Other Prone Lumbar Exercises  Scap retraction + upper back extension over green Pball 10 x 3"      Lumbar Exercises: Quadruped   Opposite Arm/Leg Raise  Right arm/Left leg;Left arm/Right leg;10 reps;3 seconds    Opposite Arm/Leg Raise Limitations  cues to maintain level torso      Modalities   Modalities  Electrical Stimulation;Moist Heat      Moist Heat Therapy   Number Minutes  Moist Heat  15 Minutes    Moist Heat Location  Lumbar Spine      Electrical Stimulation   Electrical Stimulation Location  L lumbar paraspinals and L calf    Electrical Stimulation Action  TENS    Electrical Stimulation Parameters  SD2, intensity to pt tolerance x 15'    Electrical Stimulation Goals  Pain             PT Education - 04/21/20 1202    Education Details  HEP update - QL stretch, green TB standing rows/low rows, superman over Pball; Instruction in set-up and use of TENS 7000 2nd edition    Person(s) Educated  Patient    Methods  Explanation;Demonstration;Handout    Comprehension  Verbalized understanding;Returned demonstration;Need further instruction       PT Short Term Goals - 04/14/20 1124      PT SHORT TERM GOAL #1   Title  Patient will be independent with  initial HEP    Status  Achieved   04/11/20     PT SHORT TERM GOAL #2   Title  Patient will verbalize/demonstrate good awareness of neutral spine posture and proper body mechanics for daily tasks    Status  Partially Met   04/14/20 - pt reporting improved awareness following education last visit, but noted to stand with knees hyperextended with hips thrust forward creating anterior pelvic tilt & excessive lumbar lordosis leading to increased LBP and L radicular pain   Target Date  04/12/20        PT Long Term Goals - 04/14/20 1124      PT LONG TERM GOAL #1   Title  Patient will be independent with ongoing/advanced HEP    Status  On-going    Target Date  05/03/20      PT LONG TERM GOAL #2   Title  Patient to demonstrate ability to achieve and maintain good spinal alignment/posturing    Status  On-going    Target Date  05/03/20      PT LONG TERM GOAL #3   Title  Patient to improve lumbar AROM to WNL without pain provocation    Status  On-going    Target Date  05/03/20      PT LONG TERM GOAL #4   Title  Patient will demonstrate improved B proximal LE strength to >/= 4+ to 5/5 for improved stability and ease of mobility    Status  On-going    Target Date  05/03/20      PT LONG TERM GOAL #5   Title  Patient will improve standing and/or walking tolerance to >/= 1 hour w/o pain interference to allow resumption of normal daily activities    Status  On-going    Target Date  05/03/20      PT LONG TERM GOAL #6   Title  Patient to report ability to perform ADLs, household and work-related tasks without increased pain    Status  On-going    Target Date  05/03/20            Plan - 04/21/20 1115    Clinical Impression Statement  Rogue Jury notes improvement with pain with HEP, with greatest benefit from prone press-ups, but continues to experience increased pain with prolonged standing or walking. He admits to continued decreased consciousness of proper standing posture but is aware of  desired alignment. Therapeutic exercises continuing to focus on extension based strengthening and postural stability with HEP updated accordingly. R lateral shift more apparent  again today with limited tolerance for correction but better after QL stretch. Session concluded with estim and moist heat to address ongoing L LE radicular pain, with instructions provided in step-up and use of home TENS 7000 2nd edition unit.    Personal Factors and Comorbidities  Fitness;Past/Current Experience    Examination-Activity Limitations  Bend;Caring for Others;Carry;Lift;Locomotion Level;Squat;Stairs;Stand    Examination-Participation Restrictions  Cleaning;Community Activity;Interpersonal Relationship;Laundry;Meal Prep;Shop;Yard Work    Rehab Potential  Good    PT Frequency  2x / week    PT Duration  6 weeks    PT Treatment/Interventions  ADLs/Self Care Home Management;Cryotherapy;Electrical Stimulation;Iontophoresis '4mg'$ /ml Dexamethasone;Moist Heat;Traction;Ultrasound;Functional mobility training;Therapeutic activities;Therapeutic exercise;Balance training;Neuromuscular re-education;Patient/family education;Manual techniques;Passive range of motion;Dry needling;Taping;Spinal Manipulations;Joint Manipulations    PT Next Visit Plan  Progress lateral shift to neutral and extension as able, postural strengthening and awarness, isometric inner core    PT Home Exercise Plan  03/22/20 - supine & seated piriformis stretches, pelvic tilts, LTR, seated HS stretch & sciatic nerve glide; 03/31/20 - lateral shift corrections, S/L open book; 04/14/20 - McKenzie extension progression    Consulted and Agree with Plan of Care  Patient       Patient will benefit from skilled therapeutic intervention in order to improve the following deficits and impairments:  Abnormal gait, Decreased activity tolerance, Decreased endurance, Decreased knowledge of precautions, Decreased mobility, Decreased range of motion, Decreased safety awareness,  Decreased strength, Difficulty walking, Hypomobility, Increased fascial restricitons, Increased muscle spasms, Impaired perceived functional ability, Impaired flexibility, Improper body mechanics, Postural dysfunction, Pain  Visit Diagnosis: Acute left-sided low back pain with left-sided sciatica  Muscle weakness (generalized)  Abnormal posture  Other symptoms and signs involving the musculoskeletal system     Problem List Patient Active Problem List   Diagnosis Date Noted  . Acute left-sided low back pain with left-sided sciatica 02/21/2020    Percival Spanish, PT, MPT 04/21/2020, 12:34 PM  [facility] [address]  [place], [place], [address] Phone: [phone]   Fax:  [phone]  Name: Kavi Almquist MRN: [id number] Date of Birth: 30-Sep-1981

## 2020-04-21 NOTE — Patient Instructions (Signed)
    Home exercise program created by Debbrah Sampedro, PT.  For questions, please contact Eithan Beagle via phone at [phone] or email at [email or web address]  [facility] [place] [address]  [address] [place], [place], [address] Phone: [phone]   Fax:  [phone]    

## 2020-04-25 ENCOUNTER — Ambulatory Visit: Payer: 59

## 2020-04-28 ENCOUNTER — Other Ambulatory Visit: Payer: Self-pay

## 2020-04-28 ENCOUNTER — Ambulatory Visit: Payer: 59 | Admitting: Physical Therapy

## 2020-04-28 ENCOUNTER — Encounter: Payer: Self-pay | Admitting: Physical Therapy

## 2020-04-28 DIAGNOSIS — R29898 Other symptoms and signs involving the musculoskeletal system: Secondary | ICD-10-CM | POA: Diagnosis not present

## 2020-04-28 DIAGNOSIS — R293 Abnormal posture: Secondary | ICD-10-CM | POA: Diagnosis not present

## 2020-04-28 DIAGNOSIS — M6281 Muscle weakness (generalized): Secondary | ICD-10-CM

## 2020-04-28 DIAGNOSIS — M5442 Lumbago with sciatica, left side: Secondary | ICD-10-CM

## 2020-04-28 NOTE — Patient Instructions (Signed)

## 2020-04-28 NOTE — Therapy (Signed)
[facility] [place] [address]  [place], [place], [address] Phone: [phone]   Fax:  [phone]  Physical Therapy Treatment / Recert  Patient Details  Name: Reginald Bush MRN: [id number] Date of Birth: 10-Feb-1981 Referring Provider (PT): Clearance Coots, MD   Encounter Date: 04/28/2020  PT End of Session - 04/28/20 1117    Visit Number  7    Number of Visits  15    Date for PT Re-Evaluation  05/26/20    Authorization Type  [facility]    PT Start Time  1117   Pt arrived late   PT Stop Time  1155    PT Time Calculation (min)  38 min    Activity Tolerance  Patient tolerated treatment well    Behavior During Therapy  [facility] for tasks assessed/performed       History reviewed. No pertinent past medical history.  History reviewed. No pertinent surgical history.  There were no vitals filed for this visit.  Subjective Assessment - 04/28/20 1121    Subjective  Pt reports pain variable t/o the day without predictable pattern or trigger other than being on feet for long periods of time.    How long can you stand comfortably?  3-4 minutes    How long can you walk comfortably?  10 minutes    Diagnostic tests  Lumbar x-ray 02/21/20: No fracture or spondylolisthesis is noted. 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 Mild anterior osteophyte formation isnoted at L1-2.    Patient Stated Goals  "for pain to be more manageable and strengthen my back so this won't happen again"    Currently in Pain?  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 PT Assessment - 04/28/20 1117      Assessment   Medical Diagnosis  Acute L LBP with L sciatica    Referring Provider (PT)  Clearance Coots, MD    Next MD Visit  PRN      Observation/Other Assessments   Focus on Therapeutic Outcomes (FOTO)   Lumbar - 53% (47% limitation)      AROM   Lumbar Flexion  hands to ankles - tighttness in legs    Lumbar Extension  15% limted - increased pain/tightness to L    Lumbar - Right Side Bend  hand to fibular head    Lumbar - Left Side Bend  hand to mid shin    Lumbar - Right Rotation  WFL    Lumbar - Left Rotation  WFL but tightness noted      Strength   Right Hip Flexion  5/5    Right Hip Extension  5/5    Right Hip External Rotation   5/5    Right Hip Internal Rotation  5/5    Right Hip ABduction  4/5    Right Hip ADduction  4+/5    Left Hip Flexion  5/5    Left Hip Extension  4+/5    Left Hip External Rotation  4+/5    Left Hip Internal Rotation  4+/5    Left Hip ABduction  4/5    Left Hip ADduction  4+/5    Right Knee Flexion  5/5    Right Knee Extension  5/5    Left Knee Flexion  5/5    Left Knee Extension  5/5    Right Ankle Dorsiflexion  5/5    Left Ankle Dorsiflexion  5/5                    [facility] Adult PT Treatment/Exercise - 04/28/20 1117      Lumbar Exercises: Aerobic   Recumbent Bike  L2 x 72mn             PT Education - 04/28/20 1153    Education Details  Review of current progress with PT and plan for recert, including potential trial of DN    Person(s) Educated  Patient    Methods  Explanation    Comprehension  Verbalized understanding       PT Short Term Goals - 04/28/20 1138      PT SHORT TERM GOAL #1   Title  Patient will be independent with initial HEP    Status  Achieved   04/11/20     PT SHORT TERM GOAL #2   Title  Patient will verbalize/demonstrate good  awareness of neutral spine posture and proper body mechanics for daily tasks    Status  Achieved   04/28/20       PT Long Term Goals - 04/28/20 1139      PT LONG TERM GOAL #1   Title  Patient will be independent with ongoing/advanced HEP    Status  Partially Met    Target Date  05/26/20      PT LONG TERM GOAL #2   Title  Patient to demonstrate ability to achieve and maintain good spinal alignment/posturing    Status  Partially Met    Target Date  05/26/20      PT LONG TERM GOAL #3   Title  Patient to improve lumbar AROM to WNL without pain provocation    Status  Partially Met    Target Date  05/26/20      PT LONG TERM GOAL #4   Title  Patient will demonstrate improved B proximal LE strength to >/= 4+ to 5/5 for improved stability and ease of mobility    Status  Partially Met    Target Date  05/26/20      PT LONG TERM GOAL #5   Title  Patient will improve standing and/or walking tolerance to >/= 1 hour w/o pain interference to allow resumption of normal daily activities    Status  On-going    Target Date  05/26/20      PT LONG TERM GOAL #6   Title  Patient to report ability to perform ADLs, household and work-related tasks without increased pain    Status  On-going    Target Date  05/26/20            Plan - 04/28/20 1123    Clinical Impression Statement  Reginald Bush 40-50% improvement in LBP and L LE radiculopathy and notes he is able to go some days w/o pain meds or wait until lateral in the  day to take meds. Pain is currently intermittent and variable t/o the day without predictable pattern or trigger other than being on feet for long periods of time. He notes improving awareness of posture with standing activities but remains inconsistent with pain still limiting standing and walking tolerance to short periods. Lumbar ROM improving with current restrictions more due to tightness than pain, with R lateral trunk shift more apparent when pain present. Pt noting greatest  feeling of increased muscle tension/tightness in L flank/buttock which may benefit from manual STM/MFR including possible DN. Overall B LE strength improving, but mild L proximal weakness still evident along with continued decreased core strength/activation. All STGs met, however LTGs only partially met/ongoing. Reginald Bush expressing interest in continuing with PT as he wishes to try to further reduce pain interference with daily activities and increase standing/walking tolerance, therefore will recommend recert for up to additional 2x/wk x 4 weeks.    Personal Factors and Comorbidities  Fitness;Past/Current Experience    Examination-Activity Limitations  Bend;Caring for Others;Carry;Lift;Locomotion Level;Squat;Stairs;Stand    Examination-Participation Restrictions  Cleaning;Community Activity;Interpersonal Relationship;Laundry;Meal Prep;Shop;Yard Work    Rehab Potential  Good    PT Frequency  2x / week    PT Duration  6 weeks    PT Treatment/Interventions  ADLs/Self Care Home Management;Cryotherapy;Electrical Stimulation;Iontophoresis 24m/ml Dexamethasone;Moist Heat;Traction;Ultrasound;Functional mobility training;Therapeutic activities;Therapeutic exercise;Balance training;Neuromuscular re-education;Patient/family education;Manual techniques;Passive range of motion;Dry needling;Taping;Spinal Manipulations;Joint Manipulations    PT Next Visit Plan  Progress lateral shift to neutral and extension as able, postural strengthening and awarness, isometric inner core, manual therapy to address increased muscle tension and decreased spinal mobility with potential DN, modalities PRN for pain    PT Home Exercise Plan  03/22/20 - supine & seated piriformis stretches, pelvic tilts, LTR, seated HS stretch & sciatic nerve glide; 03/31/20 - lateral shift corrections, S/L open book; 04/14/20 - McKenzie extension progression    Consulted and Agree with Plan of Care  Patient       Patient will benefit from skilled  therapeutic intervention in order to improve the following deficits and impairments:  Abnormal gait, Decreased activity tolerance, Decreased endurance, Decreased knowledge of precautions, Decreased mobility, Decreased range of motion, Decreased safety awareness, Decreased strength, Difficulty walking, Hypomobility, Increased fascial restricitons, Increased muscle spasms, Impaired perceived functional ability, Impaired flexibility, Improper body mechanics, Postural dysfunction, Pain  Visit Diagnosis: Acute left-sided low back pain with left-sided sciatica  Muscle weakness (generalized)  Abnormal posture  Other symptoms and signs involving the musculoskeletal system     Problem List Patient Active Problem List   Diagnosis Date Noted  . Acute left-sided low back pain with left-sided sciatica 02/21/2020    JPercival Spanish PT, MPT 04/28/2020, 12:47 PM  [facility] [place] [address]Phone: [phone]  Fax:  [phone] Name: MCorday WykaMRN: [id number] of Birth: 504-20-1982

## 2020-05-02 ENCOUNTER — Ambulatory Visit: Payer: 59

## 2020-05-03 ENCOUNTER — Ambulatory Visit: Payer: 59

## 2020-05-09 ENCOUNTER — Ambulatory Visit: Payer: 59 | Admitting: Physical Therapy

## 2020-05-11 ENCOUNTER — Ambulatory Visit: Payer: 59 | Attending: Family Medicine

## 2020-05-11 DIAGNOSIS — R29898 Other symptoms and signs involving the musculoskeletal system: Secondary | ICD-10-CM | POA: Insufficient documentation

## 2020-05-11 DIAGNOSIS — R293 Abnormal posture: Secondary | ICD-10-CM | POA: Insufficient documentation

## 2020-05-11 DIAGNOSIS — M5442 Lumbago with sciatica, left side: Secondary | ICD-10-CM | POA: Insufficient documentation

## 2020-05-11 DIAGNOSIS — M6281 Muscle weakness (generalized): Secondary | ICD-10-CM | POA: Insufficient documentation

## 2020-05-16 ENCOUNTER — Other Ambulatory Visit: Payer: Self-pay

## 2020-05-16 ENCOUNTER — Ambulatory Visit: Payer: 59

## 2020-05-16 DIAGNOSIS — R293 Abnormal posture: Secondary | ICD-10-CM | POA: Diagnosis not present

## 2020-05-16 DIAGNOSIS — R29898 Other symptoms and signs involving the musculoskeletal system: Secondary | ICD-10-CM | POA: Diagnosis not present

## 2020-05-16 DIAGNOSIS — M5442 Lumbago with sciatica, left side: Secondary | ICD-10-CM | POA: Diagnosis not present

## 2020-05-16 DIAGNOSIS — M6281 Muscle weakness (generalized): Secondary | ICD-10-CM

## 2020-05-16 NOTE — Therapy (Addendum)
[facility] [address]  [place], [place], [address] Phone: [phone]   Fax:  [phone]  Physical Therapy Treatment  Patient Details  Name: Reginald Bush MRN: [id number] Date of Birth: 12/31/1980 Referring Provider (PT): Clearance Coots, MD   Encounter Date: 05/16/2020  PT End of Session - 05/16/20 1022    Visit Number  8    Number of Visits  15    Date for PT Re-Evaluation  05/26/20    Authorization Type  [facility]    PT Start Time  1018    PT Stop Time  1056    PT Time Calculation (min)  38 min    Activity Tolerance  Patient tolerated treatment well    Behavior During Therapy  [facility] for tasks assessed/performed       No past medical history on file.  No past surgical history on file.  There were no vitals filed for this visit.  Subjective Assessment - 05/16/20 1024    Subjective  Pt. reporting he is doing better.  Feels working around the house is helping for lower back to feel back.    Diagnostic tests  Lumbar x-ray 02/21/20: No fracture or spondylolisthesis is noted. 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 Mild anterior osteophyte formation isnoted at L1-2.    Patient Stated Goals  "for pain to be more manageable and strengthen my back so this won't happen again"    Currently in Pain?  No/denies    Pain Score  0-No pain   up to a 8/10 at times   Pain Location  Back    Pain Orientation  Lower;Left    Multiple Pain Sites  No         [facility] PT Assessment - 05/16/20 0001      Observation/Other Assessments   Focus on Therapeutic Outcomes (FOTO)   77% (23% limitation)                     [facility] Adult PT Treatment/Exercise - 05/16/20 0001      Lumbar Exercises: Stretches   Lower Trunk Rotation Limitations  5" x 10     Lumbar Stabilization Level 1  2 reps;30 seconds    Lumbar Stabilization Level 1 Limitations  prone childs pose     Press Ups  10 reps    Piriformis Stretch   Right;Left;2 reps;30 seconds    Piriformis Stretch Limitations  supine KTOS      Lumbar Exercises: Aerobic   Tread Mill  Lvl 1.5, 6 min       Lumbar Exercises: Standing   Row  Both;10 reps;Strengthening;Theraband    Theraband Level (Row)  Level 3 (Green)    Row Limitations  Staggered stance     Shoulder Extension  Both;10 reps;Strengthening;Theraband    Theraband Level (Shoulder Extension)  Level 3 (Green)      Lumbar Exercises: Supine   Dead Bug  10 reps;3 seconds    Dead Bug Limitations  Cues for neutral spine       Lumbar Exercises: Quadruped   Opposite Arm/Leg Raise  Right arm/Left leg;Left arm/Right leg;10 reps;3 seconds    Opposite Arm/Leg Raise Limitations  cues for neutral lumbar spine              PT Education - 05/16/20 1202    Education Details  HEP update;  DeadBug    Person(s) Educated  Patient    Methods  Explanation;Demonstration;Verbal cues;Handout  Comprehension  Verbalized understanding;Returned demonstration;Verbal cues required       PT Short Term Goals - 04/28/20 1138      PT SHORT TERM GOAL #1   Title  Patient will be independent with initial HEP    Status  Achieved   04/11/20     PT SHORT TERM GOAL #2   Title  Patient will verbalize/demonstrate good awareness of neutral spine posture and proper body mechanics for daily tasks    Status  Achieved   04/28/20       PT Long Term Goals - 04/28/20 1139      PT LONG TERM GOAL #1   Title  Patient will be independent with ongoing/advanced HEP    Status  Partially Met    Target Date  05/26/20      PT LONG TERM GOAL #2   Title  Patient to demonstrate ability to achieve and maintain good spinal alignment/posturing    Status  Partially Met    Target Date  05/26/20      PT LONG TERM GOAL #3   Title  Patient to improve lumbar AROM to WNL without pain provocation    Status  Partially Met    Target Date  05/26/20      PT LONG TERM GOAL #4   Title  Patient will demonstrate improved B proximal  LE strength to >/= 4+ to 5/5 for improved stability and ease of mobility    Status  Partially Met    Target Date  05/26/20      PT LONG TERM GOAL #5   Title  Patient will improve standing and/or walking tolerance to >/= 1 hour w/o pain interference to allow resumption of normal daily activities    Status  On-going    Target Date  05/26/20      PT LONG TERM GOAL #6   Title  Patient to report ability to perform ADLs, household and work-related tasks without increased pain    Status  On-going    Target Date  05/26/20            Plan - 05/16/20 1023    Clinical Impression Statement  Pt. doing ok today notes improving LBP.  Pt. noting 60-70% improvement in LBP since starting therapy.  Pt. noting he needs to change to 1x/week with PT frequency for his schedule.  Tolerated progression of lumbopelvic strengthening and ROM activities without increased pain.  Did discuss proper positioning with potting plants to reduce lumbar strain as pt. noting, "back spasm" after working with wife in yard yesterday.  Pt. seems to be verbalizing improved awareness of strategies to reduce lumbar strain.  Ended visit with pt. pain free thus modalities deferred.    Rehab Potential  Good    PT Frequency  2x / week    PT Treatment/Interventions  ADLs/Self Care Home Management;Cryotherapy;Electrical Stimulation;Iontophoresis '4mg'$ /ml Dexamethasone;Moist Heat;Traction;Ultrasound;Functional mobility training;Therapeutic activities;Therapeutic exercise;Balance training;Neuromuscular re-education;Patient/family education;Manual techniques;Passive range of motion;Dry needling;Taping;Spinal Manipulations;Joint Manipulations    PT Next Visit Plan  Progress lateral shift to neutral and extension as able, postural strengthening and awarness, isometric inner core, manual therapy to address increased muscle tension and decreased spinal mobility with potential DN, modalities PRN for pain    PT Home Exercise Plan  03/22/20 - supine &  seated piriformis stretches, pelvic tilts, LTR, seated HS stretch & sciatic nerve glide; 03/31/20 - lateral shift corrections, S/L open book; 04/14/20 - McKenzie extension progression    Consulted and Agree with Plan of Care  Patient       Patient will benefit from skilled therapeutic intervention in order to improve the following deficits and impairments:  Abnormal gait, Decreased activity tolerance, Decreased endurance, Decreased knowledge of precautions, Decreased mobility, Decreased range of motion, Decreased safety awareness, Decreased strength, Difficulty walking, Hypomobility, Increased fascial restricitons, Increased muscle spasms, Impaired perceived functional ability, Impaired flexibility, Improper body mechanics, Postural dysfunction, Pain  Visit Diagnosis: Acute left-sided low back pain with left-sided sciatica  Muscle weakness (generalized)  Abnormal posture  Other symptoms and signs involving the musculoskeletal system     Problem List Patient Active Problem List   Diagnosis Date Noted  . Acute left-sided low back pain with left-sided sciatica 02/21/2020    Bess Harvest, PTA 05/16/20 12:03 PM   [facility] [address]  [place], [place], [address] Phone: [phone]   Fax:  [phone]  Name: Reginald Bush MRN: [id number] Date of Birth: [birth year]

## 2020-05-24 ENCOUNTER — Ambulatory Visit: Payer: 59 | Admitting: Physical Therapy

## 2020-05-24 ENCOUNTER — Encounter: Payer: Self-pay | Admitting: Physical Therapy

## 2020-05-24 ENCOUNTER — Other Ambulatory Visit: Payer: Self-pay

## 2020-05-24 DIAGNOSIS — R29898 Other symptoms and signs involving the musculoskeletal system: Secondary | ICD-10-CM

## 2020-05-24 DIAGNOSIS — M5442 Lumbago with sciatica, left side: Secondary | ICD-10-CM

## 2020-05-24 DIAGNOSIS — R293 Abnormal posture: Secondary | ICD-10-CM

## 2020-05-24 DIAGNOSIS — M6281 Muscle weakness (generalized): Secondary | ICD-10-CM | POA: Diagnosis not present

## 2020-05-24 NOTE — Therapy (Signed)
Reginald Bush, Reginald Bush, Reginald Bush   Fax:  [phone]  Physical Therapy Treatment  Patient Details  Name: Reginald Bush MRN: [id number] Date of Birth: 09/28/1981 Referring Provider (PT): Clearance Coots, MD   Encounter Date: 05/24/2020   PT End of Session - 05/24/20 1144    Visit Number 9    Date for PT Re-Evaluation 05/26/20    Authorization Type [facility]    PT Start Time 1105    PT Stop Time 1146    PT Time Calculation (min) 41 min    Activity Tolerance Patient tolerated treatment well    Behavior During Therapy [facility] for tasks assessed/performed           History reviewed. No pertinent past medical history.  History reviewed. No pertinent surgical history.  There were no vitals filed for this visit.   Subjective Assessment - 05/24/20 1110    Subjective Patient reports that he is continuing to improve, less pain, pain is mostly with standing, sitting or bending    Currently in Pain? No/denies    Aggravating Factors  reports that over the last week he has had pain up to 8/10 while getting out of bed                             [facility] Adult PT Treatment/Exercise - 05/24/20 0001      Lumbar Exercises: Stretches   Passive Hamstring Stretch Left;3 reps;20 seconds    Piriformis Stretch Right;Left;3 reps;20 seconds      Lumbar Exercises: Aerobic   Nustep level 5 x 6 minutes      Lumbar Exercises: Machines for Strengthening   Other Lumbar Machine Exercise seated row 20# 2x10, cues for form, lat pulls 20# 2x10      Lumbar Exercises: Standing   Row Both;20 reps;Theraband    Theraband Level (Row) Level 2 (Red)    Shoulder Extension Both;20 reps;Theraband    Theraband Level (Shoulder Extension) Level 2 (Red)    Shoulder Extension Limitations cues for posture and core activation    Other Standing Lumbar Exercises 10# and 5# Pallof press 10x each again  cues needed for posture and core activation      Lumbar Exercises: Supine   Other Supine Lumbar Exercises feet on ball K2C, trunk rotation, small bridges and isometric abs                  PT Education - 05/24/20 1143    Education Details Went over hte importance of flexibility, core strength and posture and body mechanics for keeping a healthy back,    Person(s) Educated Patient    Methods Explanation;Demonstration;Tactile cues;Verbal cues    Comprehension Verbalized understanding            PT Short Term Goals - 04/28/20 1138      PT SHORT TERM GOAL #1   Title Patient will be independent with initial HEP    Status Achieved   04/11/20     PT SHORT TERM GOAL #2   Title Patient will verbalize/demonstrate good awareness of neutral spine posture and proper body mechanics for daily tasks    Status Achieved   04/28/20            PT Long Term Goals - 05/24/20 1147      PT LONG TERM GOAL #1   Title Patient will be independent with  ongoing/advanced HEP    Status Partially Met      PT LONG TERM GOAL #2   Title Patient to demonstrate ability to achieve and maintain good spinal alignment/posturing    Status Partially Met      PT LONG TERM GOAL #3   Title Patient to improve lumbar AROM to WNL without pain provocation    Status Partially Met                 Plan - 05/24/20 1144    Clinical Impression Statement Patient reports that since he was last a PT he had one instance of LBP up to 8/10 with trying to get up out of bed.  He needed a lot of cues with all exercises for form and for posture, he reports that he is trying to be mindful of his posture at work, I suggested everytime he looks at his watch he thinks about posture to try to help with this.  He continues to have some pain with transitional movements, I think due to instability and core weakness    PT Next Visit Plan Will need renewal next visit.  I would like to continue core strength, LE flexibility.            Patient will benefit from skilled therapeutic intervention in order to improve the following deficits and impairments:  Abnormal gait, Decreased activity tolerance, Decreased endurance, Decreased knowledge of precautions, Decreased mobility, Decreased range of motion, Decreased safety awareness, Decreased strength, Difficulty walking, Hypomobility, Increased fascial restricitons, Increased muscle spasms, Impaired perceived functional ability, Impaired flexibility, Improper body mechanics, Postural dysfunction, Pain  Visit Diagnosis: Acute left-sided low back pain with left-sided sciatica  Muscle weakness (generalized)  Abnormal posture  Other symptoms and signs involving the musculoskeletal system     Problem List Patient Active Problem List   Diagnosis Date Noted  . Acute left-sided low back pain with left-sided sciatica 02/21/2020    Sumner Boast., PT 05/24/2020, 11:47 AM  [facility] [address]  [place], [place], [address] Phone: [phone]   Fax:  [phone]  Name: Reginald Bush MRN: [id number] Date of Birth: 05/16/1981

## 2020-05-26 ENCOUNTER — Encounter: Payer: 59 | Admitting: Physical Therapy

## 2020-05-31 ENCOUNTER — Encounter: Payer: Self-pay | Admitting: Physical Therapy

## 2020-05-31 ENCOUNTER — Ambulatory Visit: Payer: 59 | Admitting: Physical Therapy

## 2020-05-31 ENCOUNTER — Other Ambulatory Visit: Payer: Self-pay

## 2020-05-31 DIAGNOSIS — R293 Abnormal posture: Secondary | ICD-10-CM

## 2020-05-31 DIAGNOSIS — R29898 Other symptoms and signs involving the musculoskeletal system: Secondary | ICD-10-CM | POA: Diagnosis not present

## 2020-05-31 DIAGNOSIS — M6281 Muscle weakness (generalized): Secondary | ICD-10-CM

## 2020-05-31 DIAGNOSIS — M5442 Lumbago with sciatica, left side: Secondary | ICD-10-CM | POA: Diagnosis not present

## 2020-05-31 NOTE — Therapy (Addendum)
[facility] [address]  [address] [place], [place], [address] Phone: [phone]   Fax:  [phone]  Physical Therapy Recert / Treatment / Discharge Summary  Patient Details  Name: Reginald Bush MRN: [id number] Date of Birth: 1981/03/10 Referring Provider (PT): Clare Gandy, MD   Encounter Date: 05/31/2020   PT End of Session - 05/31/20 1153    Visit Number 10    Number of Visits 14    Date for PT Re-Evaluation 06/28/20    Authorization Type [facility]    PT Start Time 1153    PT Stop Time 1235    PT Time Calculation (min) 42 min    Activity Tolerance Patient tolerated treatment well    Behavior During Therapy [facility] for tasks assessed/performed           History reviewed. No pertinent past medical history.  History reviewed. No pertinent surgical history.  There were no vitals filed for this visit.   Subjective Assessment - 05/31/20 1156    Subjective Pt noting 70% improvement with PT. Pain now typically only with prolonged static standing. Requiring less pain meds.    Limitations Standing;Walking    How long can you stand comfortably? 3-4 minutes    How long can you walk comfortably? 15-20 minutes    Diagnostic tests Lumbar x-ray 02/21/20: No fracture or spondylolisthesis is noted. 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 Mild anterior osteophyte formation isnoted at L1-2.    Patient Stated Goals "for pain to be more manageable and strengthen my back so this won't happen again"    Currently in Pain? Yes    Pain Score 1     Pain Location Back    Pain Orientation Lower    Pain Descriptors / Indicators Shooting    Pain Type Acute pain    Pain Radiating Towards ocassional radicular pain down to ankle - less frequent & intense    Pain Frequency Intermittent    Aggravating Factors  prolonged standing, yardwork    Pain Relieving Factors OTC pain meds (needed less frequently)              [facility] PT  Assessment - 05/31/20 1153      Assessment   Medical Diagnosis Acute L LBP with L sciatica    Referring Provider (PT) Clare Gandy, MD    Next MD Visit PRN      Observation/Other Assessments   Focus on Therapeutic Outcomes (FOTO)  Lumbar - 65% (35% limitation)      AROM   Lumbar Flexion hands to ankles - tighttness in legs    Lumbar Extension [facility]    Lumbar - Right Side Bend [facility]    Lumbar - Left Side Bend [facility]    Lumbar - Right Rotation [facility]    Lumbar - Left Rotation [facility]      Strength   Right Hip Flexion 5/5    Right Hip Extension 5/5    Right Hip External Rotation  5/5    Right Hip Internal Rotation 5/5    Right Hip ABduction 4+/5    Right Hip ADduction 4+/5    Left Hip Flexion 5/5    Left Hip Extension 5/5    Left Hip External Rotation 4+/5    Left Hip Internal Rotation 5/5    Left Hip ABduction 4/5    Left Hip ADduction 4+/5    Right Knee Flexion 5/5    Right Knee Extension 5/5    Left  Knee Flexion 5/5    Left Knee Extension 5/5    Right Ankle Dorsiflexion 5/5    Left Ankle Dorsiflexion 5/5                         [facility] Adult PT Treatment/Exercise - 05/31/20 1153      Exercises   Exercises Lumbar      Lumbar Exercises: Aerobic   Recumbent Bike L2 x 6 min      Lumbar Exercises: Supine   Bent Knee Raise 10 reps;5 seconds    Bent Knee Raise Limitations up/up/down/down to table top postion    Large Ball Abdominal Isometric 10 reps;3 seconds    Large Ball Abdominal Isometric Limitations green Pball    Large Ball Oblique Isometric 10 reps;3 seconds    Large Ball Oblique Isometric Limitations green Pball    Other Supine Lumbar Exercises feet on ball K2C 10 x 5"                   PT Education - 05/31/20 1224    Education Details Role of DN and expected respoise to treatment    Person(s) Educated Patient    Methods Explanation;Handout    Comprehension Verbalized understanding            PT Short Term Goals - 04/28/20 1138      PT  SHORT TERM GOAL #1   Title Patient will be independent with initial HEP    Status Achieved   04/11/20     PT SHORT TERM GOAL #2   Title Patient will verbalize/demonstrate good awareness of neutral spine posture and proper body mechanics for daily tasks    Status Achieved   04/28/20            PT Long Term Goals - 05/31/20 1205      PT LONG TERM GOAL #1   Title Patient will be independent with ongoing/advanced HEP    Status Partially Met    Target Date 06/28/20      PT LONG TERM GOAL #2   Title Patient to demonstrate ability to achieve and maintain good spinal alignment/posturing    Status Achieved   05/31/20     PT LONG TERM GOAL #3   Title Patient to improve lumbar AROM to WNL without pain provocation    Status Partially Met   05/31/20 - met except flexion ROM but pt feels that motion is back to normal   Target Date 06/28/20      PT LONG TERM GOAL #4   Title Patient will demonstrate improved B proximal LE strength to >/= 4+ to 5/5 for improved stability and ease of mobility    Status Partially Met    Target Date 06/28/20      PT LONG TERM GOAL #5   Title Patient will improve standing and/or walking tolerance to >/= 1 hour w/o pain interference to allow resumption of normal daily activities    Status On-going    Target Date 06/28/20      PT LONG TERM GOAL #6   Title Patient to report ability to perform ADLs, household and work-related tasks without increased pain    Status Partially Met    Target Date 06/28/20                 Plan - 05/31/20 1225    Clinical Impression Statement Reginald Bush noting 70% improvement with PT thus far with pain much less frequent and  intense, typically only associated with prolonged static standing.  He notes improved postural awareness and feels that he is better able to utilize proper posture and body mechanics with daily tasks including lifting 30 bags of sand and rocks for his wife (no increased pain with this). Lumbar ROM now essentially  back to normal with only mild limitation in flexion due to muscle tightness which he feels is his baseline. Overall LE strength improved but still some mild proximal LE and core weakness evident. Goals mostly met or partially met with exception of standing tolerance. Pt would like to continue PT to further address muscle tension/tightness, potentially incorporating DN, as well as core strengthening to promote improved standing tolerance and allow increased participation in recreational activities such as playing basketball, therefore will recommend recert with frequency reduced to 1x/wk for up to 4 weeks.    Personal Factors and Comorbidities Fitness;Past/Current Experience    Examination-Activity Limitations Stand    Examination-Participation Restrictions Community Activity;Yard Work    Rehab Potential Good    PT Frequency 1x / week    PT Duration 4 weeks    PT Treatment/Interventions ADLs/Self Care Home Management;Cryotherapy;Electrical Stimulation;Iontophoresis '4mg'$ /ml Dexamethasone;Moist Heat;Traction;Ultrasound;Functional mobility training;Therapeutic activities;Therapeutic exercise;Balance training;Neuromuscular re-education;Patient/family education;Manual techniques;Passive range of motion;Dry needling;Taping;Spinal Manipulations;Joint Manipulations    PT Next Visit Plan manual therapy incorporating DN as indicated, core strength, LE flexibility.    PT Home Exercise Plan 03/22/20 - supine & seated piriformis stretches, pelvic tilts, LTR, seated HS stretch & sciatic nerve glide; 03/31/20 - lateral shift corrections, S/L open book; 04/14/20 - McKenzie extension progression    Consulted and Agree with Plan of Care Patient           Patient will benefit from skilled therapeutic intervention in order to improve the following deficits and impairments:  Abnormal gait, Decreased activity tolerance, Decreased endurance, Decreased knowledge of precautions, Decreased mobility, Decreased range of motion,  Decreased safety awareness, Decreased strength, Difficulty walking, Hypomobility, Increased fascial restricitons, Increased muscle spasms, Impaired perceived functional ability, Impaired flexibility, Improper body mechanics, Postural dysfunction, Pain  Visit Diagnosis: Acute left-sided low back pain with left-sided sciatica  Muscle weakness (generalized)  Abnormal posture  Other symptoms and signs involving the musculoskeletal system     Problem List Patient Active Problem List   Diagnosis Date Noted  . Acute left-sided low back pain with left-sided sciatica 02/21/2020    Percival Spanish, PT, MPT 05/31/2020, 12:56 PM  [facility] [address]  [place], [place], [address] Phone: [phone]   Fax:  [phone]  Name: Reginald Bush MRN: [id number] Date of Birth: 12-14-1980    PHYSICAL THERAPY DISCHARGE SUMMARY  Visits from Start of Care: 10  Current functional level related to goals / functional outcomes:   Refer to above clinical impression for status as of last visit (recert) on 0/81/4481. Patient failed to schedule any further visits in >30 days, therefore will proceed with discharge from PT for this episode.   Remaining deficits:   As above. Pt did not return for any further visits as planned.   Education / Equipment:   HEP  Plan: Patient agrees to discharge.  Patient goals were partially met. Patient is being discharged due to not returning since the last visit.  ?????     Percival Spanish, PT, MPT 07/26/20, 10:42 AM  [facility] [place], [place], [address] Phone: [phone]   Fax:  [phone]    

## 2020-05-31 NOTE — Patient Instructions (Signed)

## 2020-06-01 DIAGNOSIS — Z23 Encounter for immunization: Secondary | ICD-10-CM | POA: Diagnosis not present

## 2020-12-02 IMAGING — DX DG LUMBAR SPINE 2-3V
4 series · 4 of 4 positions shown · non-contrast
Comparison: None.

CLINICAL DATA: Chronic low back and left leg pain without known
injury.

EXAM:
LUMBAR SPINE - 2-3 VIEW

[l-spine ap (1 of 2)]
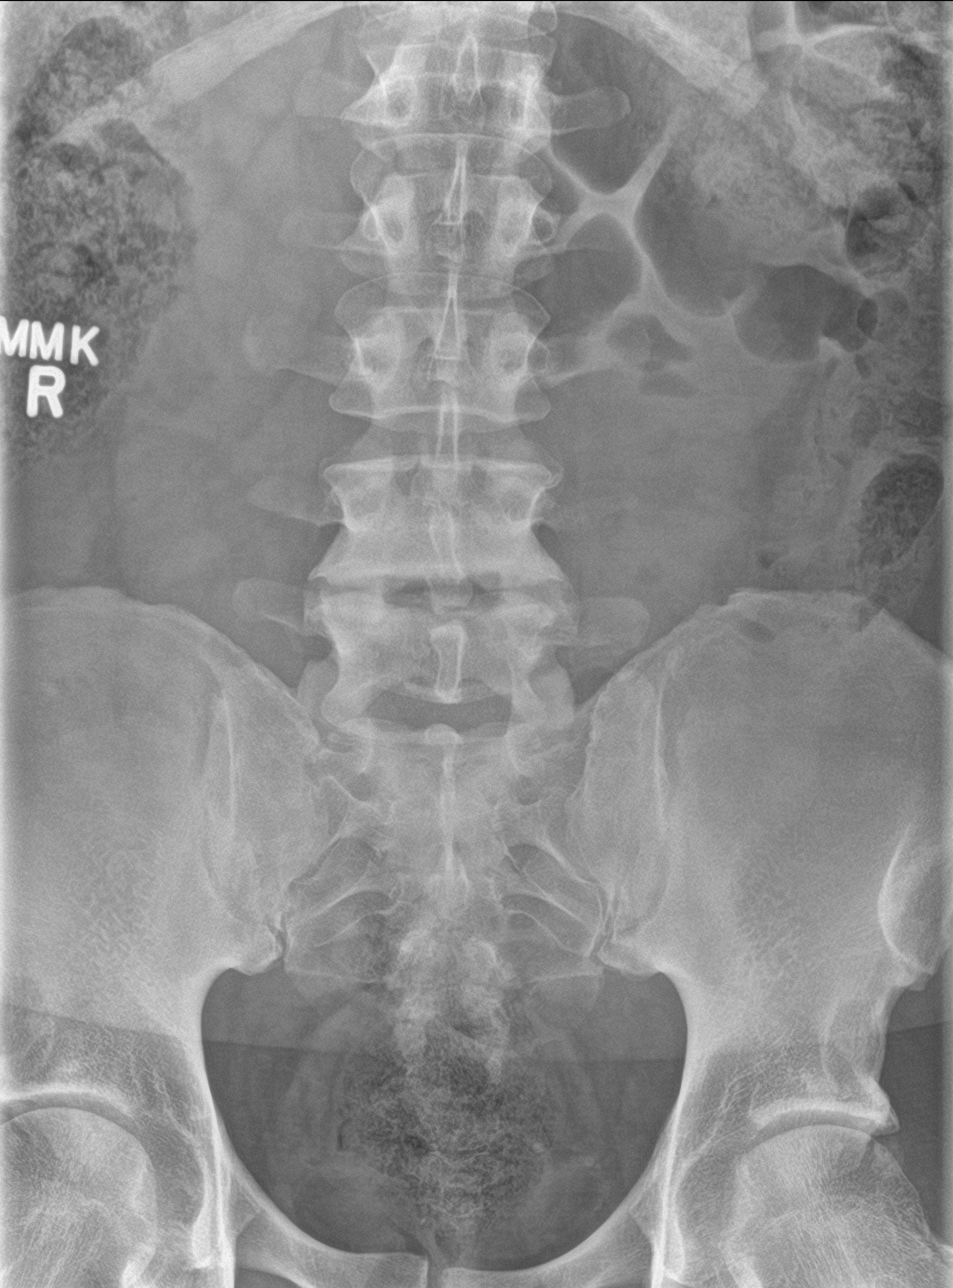

[l-spine lat]
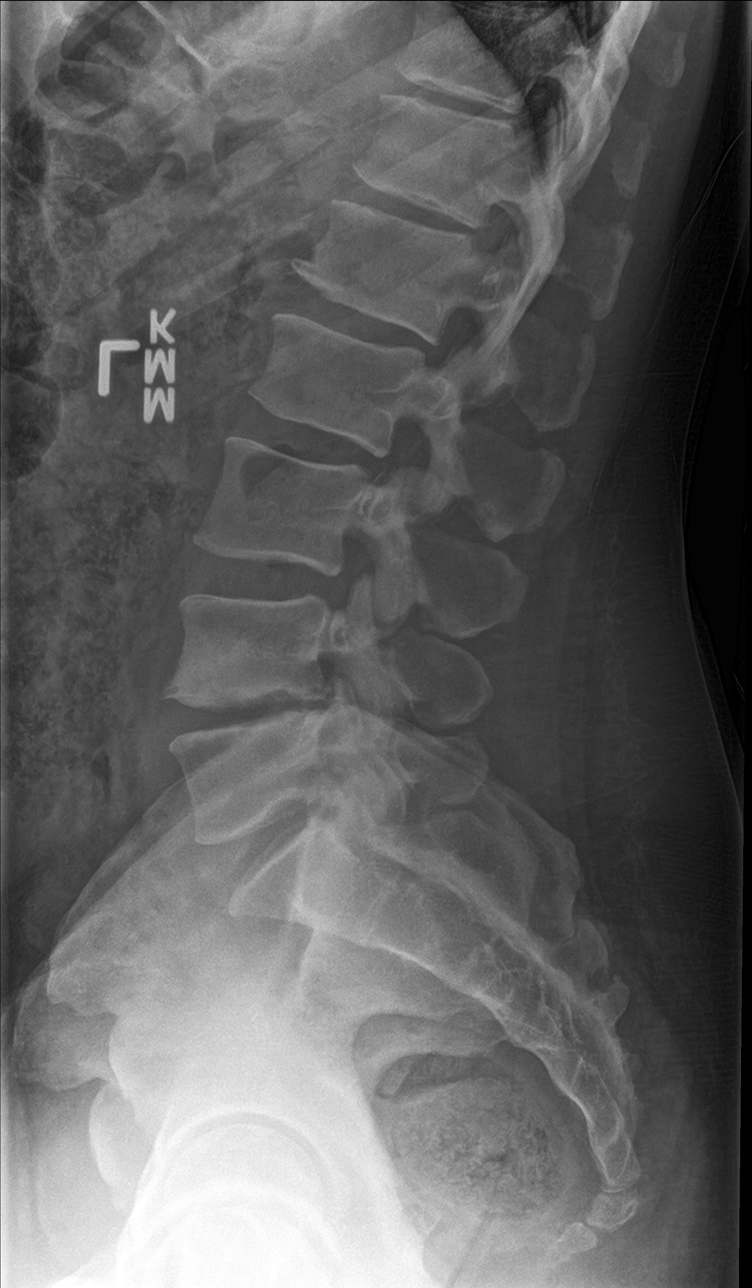

[l-spine spot]
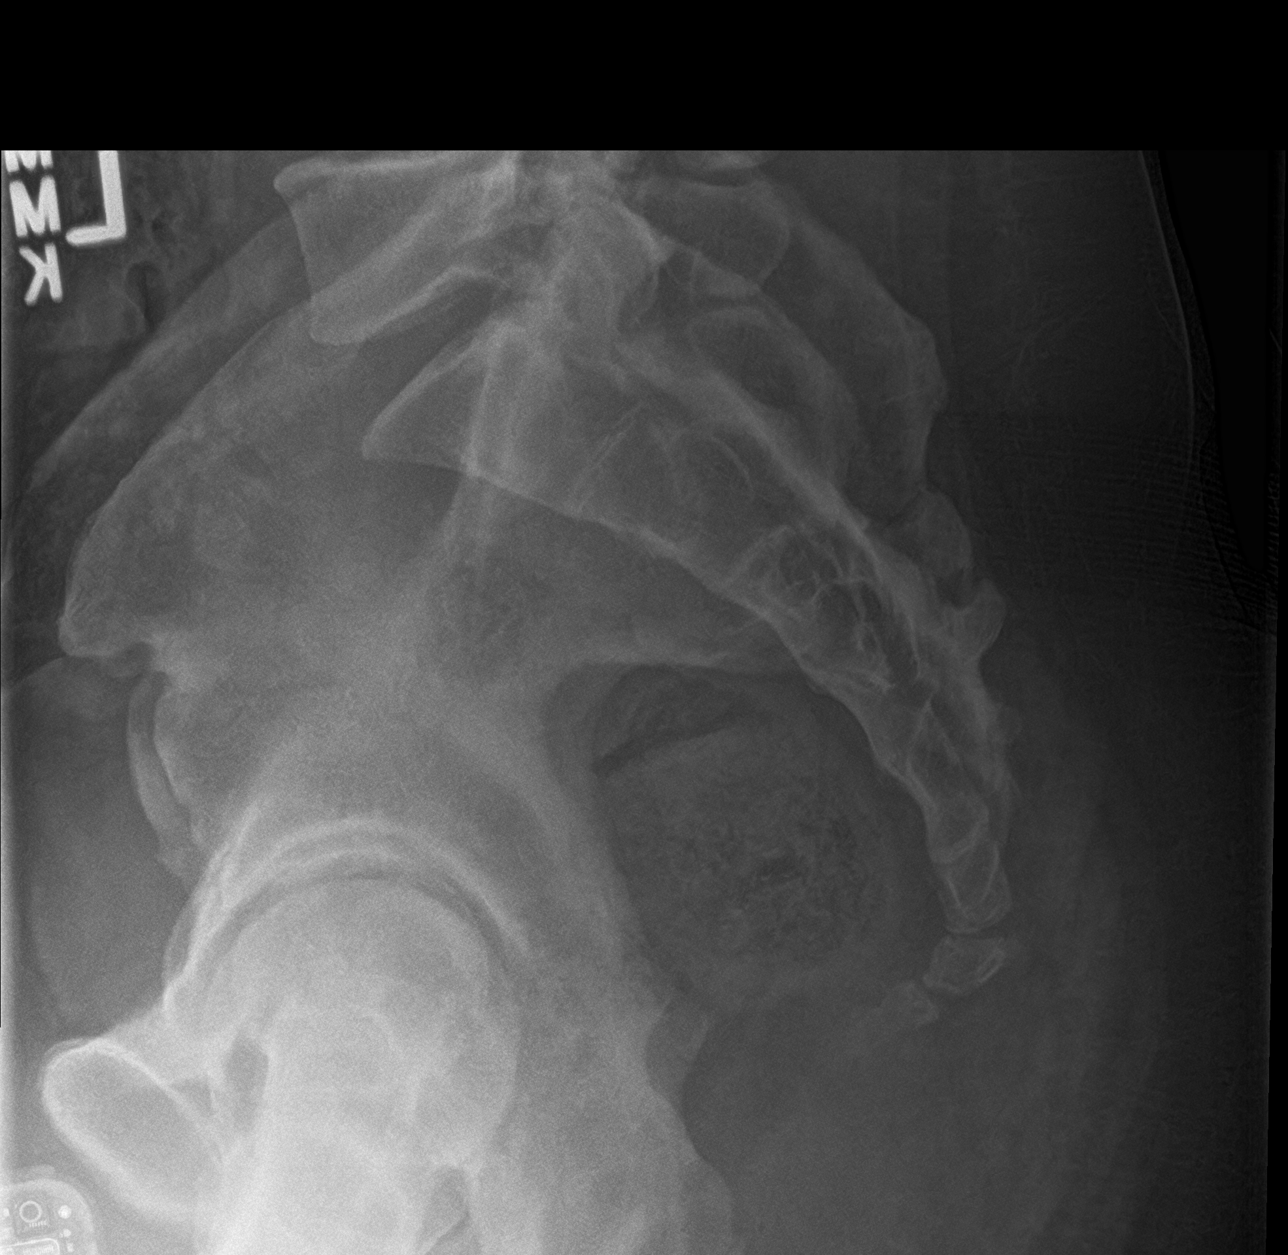

[l-spine ap (2 of 2)]
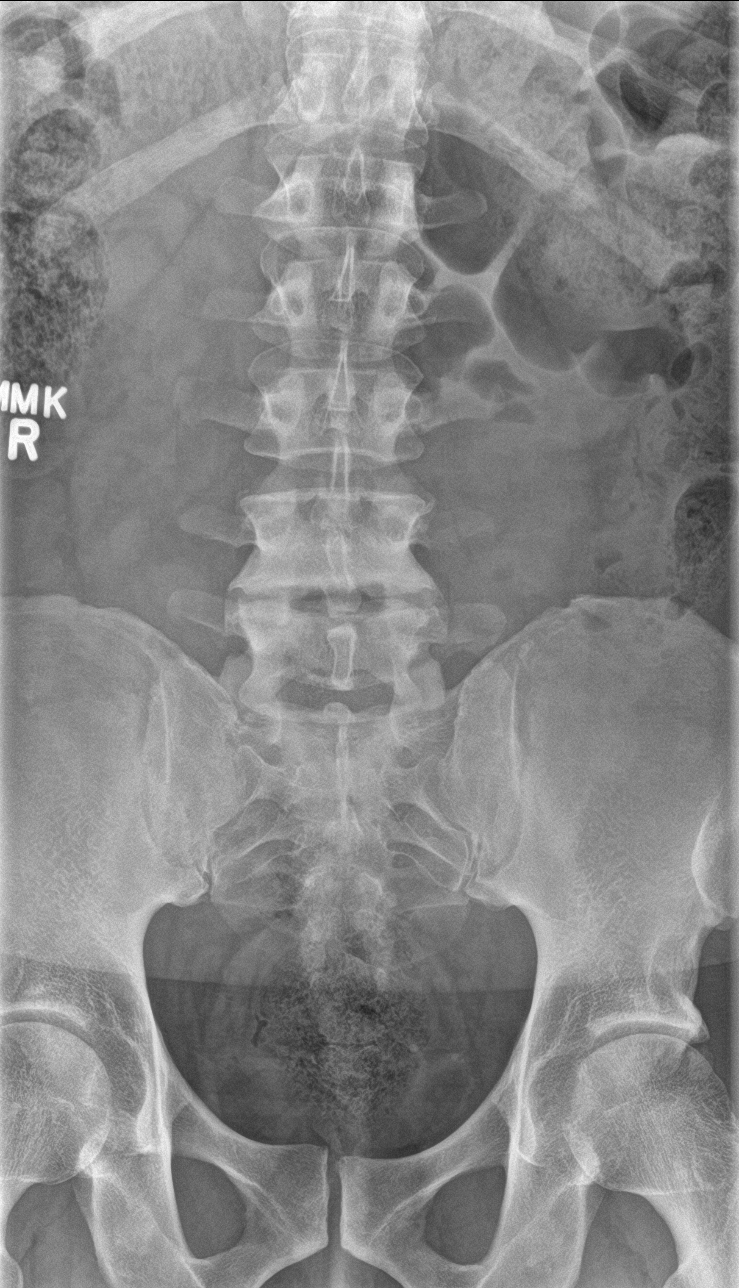

[4 of 4 positions shown; findings below may reference images not displayed]

FINDINGS: No fracture or spondylolisthesis is noted. Moderate degenerative
disc disease is noted at L4-5. Mild anterior osteophyte formation is
noted at L1-2.
IMPRESSION: Multilevel degenerative disc disease. No acute abnormality seen in
the lumbar spine.

## 2021-03-13 ENCOUNTER — Ambulatory Visit: Payer: 59 | Admitting: Family Medicine

## 2021-04-06 ENCOUNTER — Encounter: Payer: Self-pay | Admitting: Family Medicine

## 2021-04-06 ENCOUNTER — Ambulatory Visit (INDEPENDENT_AMBULATORY_CARE_PROVIDER_SITE_OTHER): Payer: No Typology Code available for payment source | Admitting: Family Medicine

## 2021-04-06 ENCOUNTER — Other Ambulatory Visit: Payer: Self-pay

## 2021-04-06 VITALS — BP 152/92 | HR 77 | Temp 97.9°F | Ht 70.0 in | Wt 235.8 lb

## 2021-04-06 DIAGNOSIS — Z13220 Encounter for screening for lipoid disorders: Secondary | ICD-10-CM

## 2021-04-06 DIAGNOSIS — E669 Obesity, unspecified: Secondary | ICD-10-CM

## 2021-04-06 DIAGNOSIS — Z114 Encounter for screening for human immunodeficiency virus [HIV]: Secondary | ICD-10-CM

## 2021-04-06 DIAGNOSIS — Z91018 Allergy to other foods: Secondary | ICD-10-CM | POA: Diagnosis not present

## 2021-04-06 DIAGNOSIS — E66811 Obesity, class 1: Secondary | ICD-10-CM

## 2021-04-06 DIAGNOSIS — Z6831 Body mass index (BMI) 31.0-31.9, adult: Secondary | ICD-10-CM | POA: Insufficient documentation

## 2021-04-06 DIAGNOSIS — Z131 Encounter for screening for diabetes mellitus: Secondary | ICD-10-CM

## 2021-04-06 DIAGNOSIS — I10 Essential (primary) hypertension: Secondary | ICD-10-CM | POA: Diagnosis not present

## 2021-04-06 DIAGNOSIS — Z1159 Encounter for screening for other viral diseases: Secondary | ICD-10-CM

## 2021-04-06 LAB — LIPID PANEL
Cholesterol: 183 mg/dL (ref 0–200)
HDL: 54.4 mg/dL (ref >39.00)
LDL Cholesterol: 114 mg/dL — ABNORMAL HIGH (ref 0–99)
NonHDL: 128.4
Total CHOL/HDL Ratio: 3
Triglycerides: 72 mg/dL (ref 0.0–149.0)
VLDL: 14.4 mg/dL (ref 0.0–40.0)

## 2021-04-06 LAB — GLUCOSE, RANDOM: Glucose, Bld: 91 mg/dL (ref 70–99)

## 2021-04-06 NOTE — Progress Notes (Signed)
[facility] [address] [place] [address] Dept: [phone] Dept Fax: [phone]  New Patient Office Visit  Subjective:    Patient ID: Reginald Bush, male    DOB: 21-Sep-1981, 40 y.o..   MRN: [id number]  Chief Complaint  Patient presents with  . Establish Care    NP- CPE/labs. Occasional increases in blood pressure, needs assessment    History of Present Illness:  Patient is in today to establish care. Mr. Metzgar is originally from [place], [place], but has lived in the [place] area since 2000. He is married and has 3 children (22, 6, 1). 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 He denies use of tobacco, alcohol,. or drugs.  Mr. Kagel notes that his blood pressures have been up and down over the years. He is not currently on medication for this and has not had symptoms. He does have a family history of hypertension on his mother's side of the family.  Mr. Martinek notes he occasional has minor depression. He states he has never had symptoms severe enough to seek medication for this. He does not feel this interferes with his ability to work or with his relationships. When he has depressive symptoms, he states he finds a way to overcome these and keep going.  Mr. Higham notes he has been on an albuterol inhaler periodically. He had a childhood history of asthma, but feels he has grown out of this as an adult. He had an illness several years ago where an inhaler was prescribed, however he does not feel a need for regular use of such. He does note a history of allergies to fish and pine nuts. The pine nut allergy involved a sensation of swelling in his throat. He does not keep an Epi-pen around, as whne he used this one time, he had an increase in his pulse and blood pressure that required treatment.  Past Medical History: Patient Active Problem List   Diagnosis  Date Noted  . Essential hypertension 04/06/2021  . Food allergy- Fish, pine nuts 04/06/2021  . Acute left-sided low back pain with left-sided sciatica 02/21/2020   Past Surgical History:  Procedure Laterality Date  . HERNIA REPAIR     as infant   Family History  Problem Relation Age of Onset  . Hypertension Mother   . Cancer Paternal Grandmother    Outpatient Medications Prior to Visit  Medication Sig Dispense Refill  . albuterol (PROVENTIL HFA;VENTOLIN HFA) 108 (90 Base) MCG/ACT inhaler Inhale 2 puffs into the lungs every 6 (six) hours as needed for wheezing or shortness of breath. 1 Inhaler 0  . Ascorbic Acid (VITAMIN C) 1000 MG tablet Take 1,000 mg by mouth daily.    . Multiple Vitamin (MULTIVITAMIN) tablet Take 1 tablet by mouth daily.    Marland Kitchen doxycycline (VIBRAMYCIN) 100 MG capsule Take 1 capsule (100 mg total) by mouth 2 (two) times daily. One po bid x 7 days (Patient not taking: Reported on 04/06/2021) 14 capsule 0  . fluticasone (FLONASE) 50 MCG/ACT nasal spray Place into the nose. (Patient not taking: Reported on 04/06/2021)    . Ibuprofen-Famotidine 800-26.6 MG TABS Take 1 tablet by mouth 2 (two) times daily. (Patient not taking: Reported on 04/06/2021)     No facility-administered medications prior to visit.   Allergies  Allergen Reactions  . Other Anaphylaxis    Pine nuts  . Shellfish Allergy Anaphylaxis  . Epinephrine  elevate HR  . Iodine Rash      Objective:   Today's Vitals   04/06/21 1116  BP: (!) 152/92  Pulse: 77  Temp: 97.9 F (36.6 C)  TempSrc: Temporal  SpO2: 99%  Weight: 235 lb 12.8 oz (107 kg)  Height: 5\' 10"  (1.778 m)   Body mass index is 33.83 kg/m.   General: Well developed, well nourished. No acute distress. HEENT: Normocephalic, non-traumatic. PERRL, EOMI. Conjunctiva clear. Fundiscopic exam shows normal disc and vasculature. External   ears normal. EAC and TMs normal bilaterally. Nose clear without congestion or rhinorrhea. Mucous  membranes moist. Oropharynx clear.   Good dentition. Neck: Supple. No lymphadenopathy. No thyromegaly. Lungs: Clear to auscultation bilaterally. No wheezing, rales or rhonchi. CV: RRR without murmurs or rubs. Pulses 2+ bilaterally. Abdomen: Soft, non-tender. Bowel sounds positive, normal pitch and frequency. No hepatosplenomegaly. No rebound or guarding. Extremities: Full ROM. No joint swelling or tenderness. No edema noted. Skin: Warm and dry. No rashes. Psych: Alert and oriented. Normal mood and affect.  Health Maintenance Due  Topic Date Due  . Hepatitis C Screening  Never done  . COVID-19 Vaccine (3 - Booster for Moderna series) 12/30/2020     Assessment & Plan:   1. Essential hypertension Mr. Kortz blood pressure is elevated int he range consistent with Stage 2 hypertension. We discussed the potential health impacts of chronic high blood pressure and approaches to blood pressure treatment, including non-pharmacologic and pharmacologic approaches. He prefers to take 3-6 months to try and lose weight to see if this will bring about improvements. We will reassess this in 3 months.  2. Obesity (BMI 30.0-34.9) Discussed the need for a plan for dietary changes and increased exercise to reduce his weight. We will reassess this in 3-4 months.  3. Food allergy- Fish, pine nuts I do feel a prescription for an albuterol inhaler is necessary at the current time. Should he have a flare of one of these allergic responses, he should seek medical care immediately.  4. Screening for diabetes mellitus (DM)  - Glucose, random  5. Screening for lipid disorders  - Lipid panel  6. Encounter for hepatitis C screening test for low risk patient  - HCV Ab w Reflex to Quant PCR  7. Screening for HIV (human immunodeficiency virus)  - HIV Antibody (routine testing w rflx)  04-12-1993, MD

## 2021-04-06 NOTE — Patient Instructions (Signed)

## 2021-04-07 LAB — HCV AB W REFLEX TO QUANT PCR: HCV Ab: <0.1 s/co ratio (ref 0.0–0.9)

## 2021-04-07 LAB — HCV INTERPRETATION

## 2021-04-09 LAB — HIV ANTIBODY (ROUTINE TESTING W REFLEX): HIV 1&2 Ab, 4th Generation: NONREACTIVE

## 2021-05-21 ENCOUNTER — Other Ambulatory Visit: Payer: Self-pay

## 2021-05-21 ENCOUNTER — Ambulatory Visit: Payer: No Typology Code available for payment source | Admitting: Family Medicine

## 2021-05-21 DIAGNOSIS — Z0289 Encounter for other administrative examinations: Secondary | ICD-10-CM

## 2021-05-23 ENCOUNTER — Encounter: Payer: Self-pay | Admitting: Family Medicine

## 2021-05-23 ENCOUNTER — Ambulatory Visit (INDEPENDENT_AMBULATORY_CARE_PROVIDER_SITE_OTHER): Payer: No Typology Code available for payment source | Admitting: Family Medicine

## 2021-05-23 ENCOUNTER — Other Ambulatory Visit: Payer: Self-pay

## 2021-05-23 VITALS — BP 162/94 | HR 83 | Temp 97.8°F | Ht 70.0 in | Wt 236.4 lb

## 2021-05-23 DIAGNOSIS — N529 Male erectile dysfunction, unspecified: Secondary | ICD-10-CM | POA: Insufficient documentation

## 2021-05-23 DIAGNOSIS — I10 Essential (primary) hypertension: Secondary | ICD-10-CM | POA: Diagnosis not present

## 2021-05-23 MED ORDER — AMLODIPINE BESY-BENAZEPRIL HCL 5-10 MG PO CAPS: 1.0000 | ORAL_CAPSULE | Freq: Every day | ORAL | 3 refills | Status: DC

## 2021-05-23 NOTE — Progress Notes (Signed)
[facility] [address] [place] [address] Dept: [phone] Dept Fax: [phone]  Office Visit  Subjective:    Patient ID: Reginald Bush, male    DOB: 1981-01-17, 40 y.o..   MRN: [id number]  Chief Complaint  Patient presents with   Acute Visit    Wants to get his testosterone level checked.  C/o having issues with ED for last year.     History of Present Illness:  Patient is in today for a discussion concerning erectile dysfunction. Reginald Bush has had an issue for several years of having an erection that does not last throughout sexual intercourse. He has had concern that this might be due to a testosterone deficiency. He notes that he was tested for this in the past,but that his levels were normal. He has not been prescribed any medication for ED in the past. In general, he has been feeling well, esp. having made some recent dietary changes. He is including fresh vegetables regularly in his diet and is drinking beet juice. He has felt more energy since making these changes.  Athis initial visit with me on 4/29, Reginald Bush was noted to have an elevated blood pressure (152/92). At the time, he was reluctant to start on blood pressure medication. As noted above, he focused on dietary changes and was wanting to lose weight. He feels surprised that his pressures are now higher.  Past Medical History: Patient Active Problem List   Diagnosis Date Noted   Erectile dysfunction 05/23/2021   Essential hypertension 04/06/2021   Food allergy- Fish, pine nuts 04/06/2021   Obesity (BMI 30.0-34.9) 04/06/2021   Acute left-sided low back pain with left-sided sciatica 02/21/2020   Past Surgical History:  Procedure Laterality Date   HERNIA REPAIR     as infant   Family History  Problem Relation Age of Onset   Hypertension Mother    Cancer Paternal Grandmother    Outpatient Medications Prior to Visit  Medication Sig  Dispense Refill   Ascorbic Acid (VITAMIN C) 1000 MG tablet Take 1,000 mg by mouth daily.     Multiple Vitamin (MULTIVITAMIN) tablet Take 1 tablet by mouth daily.     albuterol (PROVENTIL HFA;VENTOLIN HFA) 108 (90 Base) MCG/ACT inhaler Inhale 2 puffs into the lungs every 6 (six) hours as needed for wheezing or shortness of breath. 1 Inhaler 0   No facility-administered medications prior to visit.   Allergies  Allergen Reactions   Other Anaphylaxis    Pine nuts   Shellfish Allergy Anaphylaxis   Epinephrine     elevate HR   Iodine Rash    Objective:   Today's Vitals   05/23/21 1136  BP: (!) 162/94  Pulse: 83  Temp: 97.8 F (36.6 C)  TempSrc: Temporal  SpO2: 96%  Weight: 236 lb 6.4 oz (107.2 kg)  Height: 5\' 10"  (1.778 m)   Body mass index is 33.92 kg/m.   General: Well developed, well nourished. No acute distress. Psych: Alert and oriented x3. Normal mood and affect.  Repeat BP x 2, right arm, sitting  165/100, 168/105  Health Maintenance Due  Topic Date Due   COVID-19 Vaccine (3 - Booster for Moderna series) 11/29/2020     Assessment & Plan:   1. Essential hypertension Reginald Bush blood pressure is in a Stage 2 hypertension range. I strong advised that he start on medication to lower his blood pressure. I recommend combination therapy to start. I will check a BMP for a  baseline and to rule out any underlying renal issues. I recommend we reassess his blood pressure in 1 month. Will plan a repeat BMP at that visit.  - Basic metabolic panel - amLODipine-benazepril (LOTREL) 5-10 MG capsule; Take 1 capsule by mouth daily.  Dispense: 30 capsule; Refill: 3  2. Erectile dysfunction, unspecified erectile dysfunction type We discussed the complex interplay of vascular, neurologic, metabolic, and psychologic influences on normal erectile function. There is a possibility that his hypertension may be playing a factor. I will have him return for a morning testosterone level. I  recommend holding off on prescribing a PDE-5 I until after we reassess his blood pressure in 1 month.  - Testosterone  Loyola Mast, MD

## 2021-05-24 ENCOUNTER — Other Ambulatory Visit: Payer: No Typology Code available for payment source

## 2021-06-20 ENCOUNTER — Ambulatory Visit: Payer: No Typology Code available for payment source | Admitting: Family Medicine

## 2021-07-06 ENCOUNTER — Ambulatory Visit: Payer: No Typology Code available for payment source | Admitting: Family Medicine

## 2021-10-19 ENCOUNTER — Other Ambulatory Visit: Payer: Self-pay

## 2021-10-19 ENCOUNTER — Ambulatory Visit (INDEPENDENT_AMBULATORY_CARE_PROVIDER_SITE_OTHER): Payer: No Typology Code available for payment source | Admitting: Family Medicine

## 2021-10-19 VITALS — BP 160/86 | HR 80 | Temp 97.4°F | Ht 70.0 in | Wt 217.2 lb

## 2021-10-19 DIAGNOSIS — I10 Essential (primary) hypertension: Secondary | ICD-10-CM

## 2021-10-19 DIAGNOSIS — L720 Epidermal cyst: Secondary | ICD-10-CM

## 2021-10-19 DIAGNOSIS — Z6831 Body mass index (BMI) 31.0-31.9, adult: Secondary | ICD-10-CM

## 2021-10-19 MED ORDER — AMLODIPINE BESY-BENAZEPRIL HCL 5-20 MG PO CAPS: 1.0000 | ORAL_CAPSULE | Freq: Every day | ORAL | 5 refills | Status: DC

## 2021-10-19 NOTE — Progress Notes (Signed)
[facility] [address] [place] [address] Dept: [phone] Dept Fax: [phone]  Chronic Care Office Visit  Subjective:    Patient ID: Reginald Bush, male    DOB: Jun 04, 1981, 40 y.o..   MRN: [id number]  Chief Complaint  Patient presents with   Follow-up    F/u HTN and c/o lump/skin irritation on lower abdominal area     History of Present Illness:  Patient is in today for reassessment of chronic medical issues.  Reginald Bush has a history of hypertension. I initiated medication for this back in June. He was supposed to have followed up in 1 month, but has not returned until today. He notes that he has been consistently on his medication. As well, he has been working at weight loss and notes his weight is down about 20 lbs.  He is exercising more regularly now. He has noted that processed foods tend to make his pressure spike, so is staying away from these.  In addition, Reginald Bush notes a lump in his lower abdomen over the past 3 days. This has been gradually reducing in size. He has had no drainage. He wondered if this might be a hernia, as he had a hernia repair in infancy.  Past Medical History: Patient Active Problem List   Diagnosis Date Noted   Epidermoid cyst of skin 10/19/2021   Erectile dysfunction 05/23/2021   Essential hypertension 04/06/2021   Food allergy- Fish, pine nuts 04/06/2021   BMI 31.0-31.9,adult 04/06/2021   Acute left-sided low back pain with left-sided sciatica 02/21/2020   Past Surgical History:  Procedure Laterality Date   HERNIA REPAIR     as infant   Family History  Problem Relation Age of Onset   Hypertension Mother    Cancer Paternal Grandmother    Outpatient Medications Prior to Visit  Medication Sig Dispense Refill   Ascorbic Acid (VITAMIN C) 1000 MG tablet Take 1,000 mg by mouth daily.     Omega-3 Fatty Acids (FISH OIL) 1000 MG CAPS Take by mouth.      amLODipine-benazepril (LOTREL) 5-10 MG capsule Take 1 capsule by mouth daily. 30 capsule 3   Multiple Vitamin (MULTIVITAMIN) tablet Take 1 tablet by mouth daily. (Patient not taking: Reported on 10/19/2021)     albuterol (PROVENTIL HFA;VENTOLIN HFA) 108 (90 Base) MCG/ACT inhaler Inhale 2 puffs into the lungs every 6 (six) hours as needed for wheezing or shortness of breath. 1 Inhaler 0   No facility-administered medications prior to visit.   Allergies  Allergen Reactions   Other Anaphylaxis    Pine nuts   Shellfish Allergy Anaphylaxis   Epinephrine     elevate HR   Iodine Rash      Objective:   Today's Vitals   10/19/21 1426  BP: (!) 160/86  Pulse: 80  Temp: (!) 97.4 F (36.3 C)  TempSrc: Temporal  SpO2: 99%  Weight: 217 lb 3.2 oz (98.5 kg)  Height: 5\' 10"  (1.778 m)   Body mass index is 31.16 kg/m.   General: Well developed, well nourished. No acute distress. Abdomen: There is 1/2-1 cm subcutaneous lump that is very mildly tender in the suprapubic   area. There is no visible lump, redness or rash associated with this. Psych: Alert and oriented. Normal mood and affect.  Health Maintenance Due  Topic Date Due   COVID-19 Vaccine (3 - Booster for Moderna series) 08/24/2020     Assessment & Plan:   1. Essential hypertension Mr.  Reginald Bush's blood pressure remains above goal. I will move him up tot he 5-20 mg dose fo the Lotrel to see if we get better control. I will check his BMP to make sure his renal function has not declined on the ACE-I therapy.  - amLODipine-benazepril (LOTREL) 5-20 MG capsule; Take 1 capsule by mouth daily.  Dispense: 30 capsule; Refill: 5 - Basic metabolic panel  2. Epidermoid cyst of skin Appears to be an inflamed cyst. As this is improving, I recommended he do hot packs to this area nightly and we monitor it for resolution. If this does not resolve or gets larger, he should follow up.  3. BMI 31.0-31.9,adult Weight is down 19 lbs. I encouraged  him to continue his efforts with a goal of his BP normalizing.  Loyola Mast, MD

## 2021-10-20 LAB — BASIC METABOLIC PANEL
BUN/Creatinine Ratio: 10 (ref 9–20)
BUN: 13 mg/dL (ref 6–24)
CO2: 25 mmol/L (ref 20–29)
Calcium: 9.5 mg/dL (ref 8.7–10.2)
Chloride: 102 mmol/L (ref 96–106)
Creatinine, Ser: 1.27 mg/dL (ref 0.76–1.27)
Glucose: 90 mg/dL (ref 70–99)
Potassium: 5.1 mmol/L (ref 3.5–5.2)
Sodium: 140 mmol/L (ref 134–144)
eGFR: 73 mL/min/{1.73_m2} (ref >59)

## 2021-11-23 ENCOUNTER — Ambulatory Visit: Payer: No Typology Code available for payment source | Admitting: Family Medicine

## 2021-11-26 ENCOUNTER — Other Ambulatory Visit (HOSPITAL_BASED_OUTPATIENT_CLINIC_OR_DEPARTMENT_OTHER): Payer: Self-pay

## 2021-11-26 MED ORDER — AMLODIPINE BESY-BENAZEPRIL HCL 5-10 MG PO CAPS
ORAL_CAPSULE | ORAL | 3 refills | Status: DC
  Filled 2021-11-26: qty 30, 30d supply, fill #0

## 2021-11-28 ENCOUNTER — Telehealth: Payer: Self-pay | Admitting: Family Medicine

## 2021-11-28 ENCOUNTER — Other Ambulatory Visit (HOSPITAL_BASED_OUTPATIENT_CLINIC_OR_DEPARTMENT_OTHER): Payer: Self-pay

## 2021-11-28 DIAGNOSIS — I10 Essential (primary) hypertension: Secondary | ICD-10-CM

## 2021-11-28 MED ORDER — AMLODIPINE BESY-BENAZEPRIL HCL 5-20 MG PO CAPS
1.0000 | ORAL_CAPSULE | Freq: Every day | ORAL | 5 refills | Status: DC
  Filled 2021-11-28: qty 90, 90d supply, fill #0
  Filled 2022-05-17: qty 90, 90d supply, fill #1

## 2021-11-28 NOTE — Telephone Encounter (Signed)
Spoke to patient and RX had been sent to [facility] but insurance will not cover his meds there and needed sent to [facility].  Rx resent to the pharmacy and patient notified VIA phone.  Dm/cma

## 2021-11-28 NOTE — Telephone Encounter (Signed)
What is the name of the medication? Reginald Bush (LOTREL) 5-20 MG capsule [[id number]]   Have you contacted your pharmacy to request a refill? Pt is needing a refill.  Which pharmacy would you like this sent to? [facility]   Patient notified that their request is being sent to the clinical staff for review and that they should receive a call once it is complete. If they do not receive a call within 72 hours they can check with their pharmacy or our office.

## 2021-12-04 ENCOUNTER — Ambulatory Visit: Payer: No Typology Code available for payment source | Admitting: Family Medicine

## 2021-12-12 ENCOUNTER — Ambulatory Visit: Payer: No Typology Code available for payment source | Admitting: Family Medicine

## 2022-01-15 ENCOUNTER — Telehealth: Payer: Self-pay | Admitting: Family Medicine

## 2022-01-15 ENCOUNTER — Encounter: Payer: Self-pay | Admitting: Family Medicine

## 2022-01-15 NOTE — Telephone Encounter (Signed)
Pt was no show for appt 12/12/21. 3rd occurrence. $50 fee generated. Final warning letter mailed.

## 2022-02-06 DIAGNOSIS — J029 Acute pharyngitis, unspecified: Secondary | ICD-10-CM | POA: Diagnosis not present

## 2022-05-17 ENCOUNTER — Other Ambulatory Visit (HOSPITAL_BASED_OUTPATIENT_CLINIC_OR_DEPARTMENT_OTHER): Payer: Self-pay

## 2022-07-30 ENCOUNTER — Ambulatory Visit: Payer: 59 | Admitting: Family Medicine

## 2022-07-30 ENCOUNTER — Telehealth: Payer: Self-pay | Admitting: Family Medicine

## 2022-07-30 ENCOUNTER — Encounter: Payer: Self-pay | Admitting: Family Medicine

## 2022-07-30 VITALS — BP 152/80 | HR 69 | Temp 97.7°F | Ht 70.0 in | Wt 213.2 lb

## 2022-07-30 DIAGNOSIS — N521 Erectile dysfunction due to diseases classified elsewhere: Secondary | ICD-10-CM

## 2022-07-30 DIAGNOSIS — I10 Essential (primary) hypertension: Secondary | ICD-10-CM

## 2022-07-30 MED ORDER — AMLODIPINE BESY-BENAZEPRIL HCL 10-20 MG PO CAPS: 1.0000 | ORAL_CAPSULE | Freq: Every day | ORAL | 3 refills | Status: DC

## 2022-07-30 MED ORDER — TADALAFIL 5 MG PO TABS: 5.0000 mg | ORAL_TABLET | Freq: Every day | ORAL | 5 refills | Status: DC | PRN

## 2022-07-30 NOTE — Telephone Encounter (Signed)
Caller Name: Josuel Koeppen Call back phone #: [phone]  Reason for Call: Pt was told that he would need a PA for his prescription Tadalafil [id number] - [place], [place] - [address] AT [facility] [address] Phone:  [phone]  Fax:  [phone]

## 2022-07-30 NOTE — Patient Instructions (Signed)

## 2022-07-30 NOTE — Progress Notes (Signed)
[facility] [address] [place] [address] Dept: [phone] Dept Fax: [phone]  Office Visit  Subjective:    Patient ID: Bonna Gains, male    DOB: June 06, 1981, 41 y.o..   MRN: [id number]  Chief Complaint  Patient presents with   Follow-up    C/o having sexual issues x 3-4 months.   BP @ home 145/80.    History of Present Illness:  Patient is in today for discussion of erectile issues. Mr. Hern notes that for the past 3-4 months he has had a worsening issue with this. This has mainly been an issue where he gets an initial erection, but then loses it before he can engage in intercourse with his wife. He also notes that his penis feels overly sensitive and he has early ejaculation at these times. He denies any particular  stressors or marital issues otherwise, though his is impacting some of their intimacy.  Mr. Keena has a history of hypertension. He is currently managed on amlodipine-benazepril 5-20 mg daily.  Past Medical History: Patient Active Problem List   Diagnosis Date Noted   Epidermoid cyst of skin 10/19/2021   Erectile dysfunction 05/23/2021   Essential hypertension 04/06/2021   Food allergy- Fish, pine nuts 04/06/2021   BMI 31.0-31.9,adult 04/06/2021   Acute left-sided low back pain with left-sided sciatica 02/21/2020   Past Surgical History:  Procedure Laterality Date   HERNIA REPAIR     as infant   Family History  Problem Relation Age of Onset   Hypertension Mother    Cancer Paternal Grandmother    Outpatient Medications Prior to Visit  Medication Sig Dispense Refill   Ascorbic Acid (VITAMIN C) 1000 MG tablet Take 1,000 mg by mouth daily.     Misc Natural Products (YUMVS BEET ROOT-TART CHERRY) 250-0.5 MG CHEW Chew by mouth.     Multiple Vitamin (MULTIVITAMIN) tablet Take 1 tablet by mouth daily.     amLODipine-benazepril (LOTREL) 5-20 MG capsule Take 1 capsule by mouth daily.  30 capsule 5   Omega-3 Fatty Acids (FISH OIL) 1000 MG CAPS Take by mouth.     amLODipine-benazepril (LOTREL) 5-10 MG capsule Take 1 capsule by mouth daily 30 capsule 3   No facility-administered medications prior to visit.   Allergies  Allergen Reactions   Other Anaphylaxis    Pine nuts   Shellfish Allergy Anaphylaxis   Epinephrine     elevate HR   Iodine Rash      Objective:   Today's Vitals   07/30/22 1045  BP: (!) 152/80  Pulse: 69  Temp: 97.7 F (36.5 C)  TempSrc: Temporal  SpO2: 99%  Weight: 213 lb 3.2 oz (96.7 kg)  Height: 5\' 10"  (1.778 m)   Body mass index is 30.59 kg/m.   General: Well developed, well nourished. No acute distress. Psych: Alert and oriented. Normal mood and affect.  Health Maintenance Due  Topic Date Due   INFLUENZA VACCINE  07/09/2022     Assessment & Plan:   1. Erectile dysfunction due to diseases classified elsewhere We discussed that hypertension does put him at increased risk for ED. At this point, it would be premature to consider issues of hypogonadism. I would recommend a trial of tadalafil. If not helping over the next 6 weeks, we would then consider more in depth evaluation.  - tadalafil (CIALIS) 5 MG tablet; Take 1 tablet (5 mg total) by mouth daily as needed for erectile dysfunction.  Dispense: 10 tablet; Refill:  5  2. Essential hypertension Mr. Erhardt blood pressure is improved form last Fall, but still remains above goal. I will try increasing the amlodipine component of his Lotrel. I will recheck this in 6 weeks.  - amLODipine-benazepril (LOTREL) 10-20 MG capsule; Take 1 capsule by mouth daily.  Dispense: 90 capsule; Refill: 3   Return in about 6 weeks (around 09/10/2022) for Reassessment.   Loyola Mast, MD

## 2022-07-31 NOTE — Telephone Encounter (Signed)
Received PA will process this today. Dm/cma

## 2022-07-31 NOTE — Telephone Encounter (Signed)
PA submitted through cover my meds to [employer].  Awaiting response.  Dm/cma    [id number] - PA Case ID: [id number]

## 2022-08-05 MED ORDER — SILDENAFIL CITRATE 100 MG PO TABS: 50.0000 mg | ORAL_TABLET | Freq: Every day | ORAL | 0 refills | Status: DC | PRN

## 2022-08-05 NOTE — Addendum Note (Signed)
Addended by: Andrez Grime on: 08/05/2022 12:16 PM   Modules accepted: Orders

## 2022-08-05 NOTE — Telephone Encounter (Signed)
PA denied by [employer] due to he has to have tried sildenafil 25 mg, 50 mg, 100 mg. His plan only covers 6 tablets per 30 days.    Can you send in different prescription due PCP being out for the week?  Please review and advise.   Thanks.  Dm/cma

## 2022-08-05 NOTE — Telephone Encounter (Signed)
Lft VM to rtn call. Dm/cma  

## 2022-08-06 ENCOUNTER — Telehealth: Payer: Self-pay | Admitting: Family Medicine

## 2022-08-06 NOTE — Telephone Encounter (Signed)
Spoke to patient advised that different RX had to be sent to the pharmacy due to no coverage.  Rx sent to [facility]. Dm/cma

## 2022-08-06 NOTE — Telephone Encounter (Signed)
Pt [phone] Pt has called about a PA on the med sciallis. Please call.

## 2022-09-05 ENCOUNTER — Other Ambulatory Visit (HOSPITAL_BASED_OUTPATIENT_CLINIC_OR_DEPARTMENT_OTHER): Payer: Self-pay

## 2022-09-05 ENCOUNTER — Telehealth: Payer: Self-pay | Admitting: Family Medicine

## 2022-09-05 DIAGNOSIS — I10 Essential (primary) hypertension: Secondary | ICD-10-CM

## 2022-09-05 MED ORDER — AMLODIPINE BESY-BENAZEPRIL HCL 10-20 MG PO CAPS
1.0000 | ORAL_CAPSULE | Freq: Every day | ORAL | 2 refills | Status: DC
  Filled 2022-09-05: qty 90, 90d supply, fill #0

## 2022-09-05 NOTE — Telephone Encounter (Signed)
Pt is wanting his amLODipine-benazepril (LOTREL) 10-20 MG capsule [[id number]] sent to [facility] [address] [place], [place] [address] [phone]

## 2022-09-05 NOTE — Telephone Encounter (Signed)
Left VM that RX was [facility].  Dm/cma

## 2022-09-06 ENCOUNTER — Other Ambulatory Visit (HOSPITAL_BASED_OUTPATIENT_CLINIC_OR_DEPARTMENT_OTHER): Payer: Self-pay

## 2022-09-16 ENCOUNTER — Telehealth: Payer: Self-pay | Admitting: Family Medicine

## 2022-09-16 ENCOUNTER — Ambulatory Visit: Payer: 59 | Admitting: Family Medicine

## 2022-09-16 NOTE — Telephone Encounter (Signed)
10.9.23 no show letter sent 

## 2022-09-17 ENCOUNTER — Encounter: Payer: Self-pay | Admitting: Family Medicine

## 2022-09-17 ENCOUNTER — Other Ambulatory Visit (HOSPITAL_BASED_OUTPATIENT_CLINIC_OR_DEPARTMENT_OTHER): Payer: Self-pay

## 2022-09-17 ENCOUNTER — Telehealth: Payer: Self-pay | Admitting: Family Medicine

## 2022-09-17 ENCOUNTER — Ambulatory Visit: Payer: 59 | Admitting: Family Medicine

## 2022-09-17 VITALS — BP 134/82 | HR 76 | Temp 98.2°F | Ht 70.0 in | Wt 213.6 lb

## 2022-09-17 DIAGNOSIS — M25561 Pain in right knee: Secondary | ICD-10-CM

## 2022-09-17 DIAGNOSIS — I10 Essential (primary) hypertension: Secondary | ICD-10-CM | POA: Diagnosis not present

## 2022-09-17 DIAGNOSIS — N529 Male erectile dysfunction, unspecified: Secondary | ICD-10-CM | POA: Diagnosis not present

## 2022-09-17 MED ORDER — AMLODIPINE BESY-BENAZEPRIL HCL 5-20 MG PO CAPS
1.0000 | ORAL_CAPSULE | Freq: Every day | ORAL | 3 refills | Status: DC
  Filled 2022-09-17 – 2023-03-06 (×2): qty 90, 90d supply, fill #0

## 2022-09-17 NOTE — Progress Notes (Signed)
[facility] [address] [place] [address] Dept: [phone] Dept Fax: [phone]  Office Visit  Subjective:    Patient ID: Reginald Bush, male    DOB: 06-28-1981, 41 y.o..   MRN: [id number]  Chief Complaint  Patient presents with   Acute Visit    C/o having pain in RT leg/knee x 3 weeks off/on.   Has used heat and no OTC meds.      History of Present Illness:  Patient is in today with several complaints.  Reginald Bush notes that 3 weeks ago, he played some basketball. Afterwards he developed some swelling in his knee and some popliteal fossa pain. He had not been regularly active prior to this and recognizes that he did not stretch out prior to exerting himself. He notes his pain and swelling have been improving over the past 2 weeks.  Reginald Bush has a history of hypertension. At his last visit, we bumped his Lotrel dose from 5-20 mg daily to 10-20 mg daily. He states that last week, he developed some pain in his posterior neck and shoulder area. he also felt he was having an increase in swallowing difficulty and possibly heartburn. He decided that this was related to the higher medication dosage. He stopped his Lotrel for 5 days, and experienced gradual improvement of his symptoms. However, he felt he was having symptoms related to his BP going up, so restarted his previous Lotrel dose of 5-20 mg daily.  I had seen Reginald Bush recently to discuss ED. I prescribed Viagra 100 mg. He notes he ahd a good response to a half-tab (50 mg). When he took a full tab, he developed a generalized flushing, which he found concerning.  Past Medical History: Patient Active Problem List   Diagnosis Date Noted   Epidermoid cyst of skin 10/19/2021   Erectile dysfunction 05/23/2021   Essential hypertension 04/06/2021   Food allergy- Fish, pine nuts 04/06/2021   BMI 31.0-31.9,adult 04/06/2021   Acute left-sided low back pain  with left-sided sciatica 02/21/2020   Past Surgical History:  Procedure Laterality Date   HERNIA REPAIR     as infant   Family History  Problem Relation Age of Onset   Hypertension Mother    Cancer Paternal Grandmother    Outpatient Medications Prior to Visit  Medication Sig Dispense Refill   Ascorbic Acid (VITAMIN C) 1000 MG tablet Take 1,000 mg by mouth daily.     Misc Natural Products (YUMVS BEET ROOT-TART CHERRY) 250-0.5 MG CHEW Chew by mouth.     Multiple Vitamin (MULTIVITAMIN) tablet Take 1 tablet by mouth daily.     sildenafil (VIAGRA) 100 MG tablet Take 0.5-1 tablets (50-100 mg total) by mouth daily as needed for erectile dysfunction. (Patient not taking: Reported on 09/17/2022) 5 tablet 0   amLODipine-benazepril (LOTREL) 10-20 MG capsule Take 1 capsule by mouth daily. (Patient not taking: Reported on 09/17/2022) 90 capsule 2   No facility-administered medications prior to visit.   Allergies  Allergen Reactions   Other Anaphylaxis    Pine nuts   Shellfish Allergy Anaphylaxis   Epinephrine     elevate HR   Iodine Rash    Objective:   Today's Vitals   09/17/22 1109  BP: 134/82  Pulse: 76  Temp: 98.2 F (36.8 C)  TempSrc: Temporal  SpO2: 98%  Weight: 213 lb 9.6 oz (96.9 kg)  Height: 5\' 10"  (1.778 m)   Body mass index is 30.65 kg/m.  General: Well developed, well nourished. No acute distress. Extremities: Full ROM. Mild swelling of the joint. Mild tenderness of the tendons on either side of the   popliteal fossa. Collateral and cruciate testing normal. McMurray's neg. No edema noted. Psych: Alert and oriented. Normal mood and affect.  Health Maintenance Due  Topic Date Due   INFLUENZA VACCINE  07/09/2022     Assessment & Plan:   1. Acute pain of right knee I suspect he has an overuse injury to the knee and calf tendons. As this is improving, I would recommend we give this more time to resolve. I would avoid NSAIDs for now, in light of his  hypertension.  2. Essential hypertension BP is in acceptable control today. We wills tay with the lower dose. I did discuss that the symptoms may not have been actual medication affects, but merely coincidental that they occurred after his medication change.  - amLODipine-benazepril (LOTREL) 5-20 MG capsule; Take 1 capsule by mouth daily.  Dispense: 90 capsule; Refill: 3  3. Erectile dysfunction, unspecified erectile dysfunction type Flushing is common with sildenafil. Since he did respond tot he 50 mg dose, I recommend we stick with this.  Return in about 3 months (around 12/18/2022) for Reassessment.   Loyola Mast, MD

## 2022-09-17 NOTE — Telephone Encounter (Signed)
error 

## 2022-09-17 NOTE — Telephone Encounter (Signed)
Pt was in office today 09/17/2022. History of 3 no shows within last 12 months: 12/04/2021 12/12/2021 09/16/2022  Please advise how you would like me to proceed?  Final warning or dismissal?

## 2022-09-17 NOTE — Telephone Encounter (Signed)
Final warning being mailed and sent mychart. Thank you.

## 2022-09-30 ENCOUNTER — Other Ambulatory Visit (HOSPITAL_BASED_OUTPATIENT_CLINIC_OR_DEPARTMENT_OTHER): Payer: Self-pay

## 2022-12-17 ENCOUNTER — Ambulatory Visit: Payer: 59 | Admitting: Family Medicine

## 2022-12-17 ENCOUNTER — Telehealth: Payer: Self-pay | Admitting: Family Medicine

## 2022-12-17 NOTE — Telephone Encounter (Signed)
PT was a no show 1/09 for Ov with Dr. Gena Fray. This is 4th

## 2022-12-24 ENCOUNTER — Encounter: Payer: Self-pay | Admitting: Family Medicine

## 2022-12-24 NOTE — Telephone Encounter (Signed)
4th no show, generated fee, prior final warning, generated dismissal letter for provider signature

## 2023-01-20 ENCOUNTER — Other Ambulatory Visit (HOSPITAL_BASED_OUTPATIENT_CLINIC_OR_DEPARTMENT_OTHER): Payer: Self-pay

## 2023-01-27 ENCOUNTER — Other Ambulatory Visit: Payer: Self-pay | Admitting: Family Medicine

## 2023-01-27 DIAGNOSIS — N521 Erectile dysfunction due to diseases classified elsewhere: Secondary | ICD-10-CM

## 2023-03-06 ENCOUNTER — Other Ambulatory Visit: Payer: Self-pay | Admitting: Family Medicine

## 2023-03-06 ENCOUNTER — Other Ambulatory Visit (HOSPITAL_BASED_OUTPATIENT_CLINIC_OR_DEPARTMENT_OTHER): Payer: Self-pay

## 2023-03-06 ENCOUNTER — Telehealth: Payer: Self-pay | Admitting: Family Medicine

## 2023-03-06 DIAGNOSIS — I10 Essential (primary) hypertension: Secondary | ICD-10-CM

## 2023-03-06 NOTE — Telephone Encounter (Signed)
Pt has been discharged and is asking what he will do about his med refills. KO said it is your discretion.  Amloddipine

## 2023-03-06 NOTE — Telephone Encounter (Signed)
Advised that we can request refill from provider. Per chart 1 yr refills sent to [facility] pharmacy 09/2022. Pt can contact pharmacy for refill of amlodipine.

## 2023-03-10 NOTE — Telephone Encounter (Signed)
LVM notifying pt RX at [facility] if he needs refill on amLODipine-benazepril (LOTREL) 5-20 MG capsule.  No further refills for other medications will be provided.

## 2023-03-10 NOTE — Telephone Encounter (Signed)
Noted. Dm/cma  

## 2023-03-24 ENCOUNTER — Encounter: Payer: Self-pay | Admitting: *Deleted

## 2023-03-26 ENCOUNTER — Other Ambulatory Visit (HOSPITAL_BASED_OUTPATIENT_CLINIC_OR_DEPARTMENT_OTHER): Payer: Self-pay

## 2023-07-02 ENCOUNTER — Ambulatory Visit: Payer: Commercial Managed Care - PPO | Admitting: Family Medicine

## 2023-07-03 ENCOUNTER — Encounter: Payer: Self-pay | Admitting: Family Medicine

## 2023-07-03 ENCOUNTER — Ambulatory Visit (INDEPENDENT_AMBULATORY_CARE_PROVIDER_SITE_OTHER): Payer: 59 | Admitting: Family Medicine

## 2023-07-03 VITALS — BP 124/78 | HR 70 | Resp 18 | Ht 70.0 in | Wt 222.8 lb

## 2023-07-03 DIAGNOSIS — R6882 Decreased libido: Secondary | ICD-10-CM | POA: Diagnosis not present

## 2023-07-03 DIAGNOSIS — F32A Depression, unspecified: Secondary | ICD-10-CM | POA: Diagnosis not present

## 2023-07-03 DIAGNOSIS — F419 Anxiety disorder, unspecified: Secondary | ICD-10-CM | POA: Diagnosis not present

## 2023-07-03 DIAGNOSIS — I10 Essential (primary) hypertension: Secondary | ICD-10-CM

## 2023-07-03 DIAGNOSIS — N521 Erectile dysfunction due to diseases classified elsewhere: Secondary | ICD-10-CM | POA: Diagnosis not present

## 2023-07-03 DIAGNOSIS — E559 Vitamin D deficiency, unspecified: Secondary | ICD-10-CM | POA: Diagnosis not present

## 2023-07-03 MED ORDER — SILDENAFIL CITRATE 100 MG PO TABS
50.0000 mg | ORAL_TABLET | Freq: Every day | ORAL | 0 refills | Status: DC | PRN
Start: 2023-07-03 — End: 2024-01-16

## 2023-07-03 MED ORDER — AMLODIPINE BESY-BENAZEPRIL HCL 5-20 MG PO CAPS
1.0000 | ORAL_CAPSULE | Freq: Every day | ORAL | 3 refills | Status: DC
Start: 2023-07-03 — End: 2023-07-21

## 2023-07-03 NOTE — Assessment & Plan Note (Signed)
-   pt has anxious feelings associated with his child swimming. Doesn't feel like they consume him - GAD7: 5

## 2023-07-03 NOTE — Assessment & Plan Note (Signed)
-   will go ahead get some blood work today  - sent in refills of amlodipine-benazepril 5-20mg . BP well controlled today so we do not need to increase - follow up in 3 months

## 2023-07-03 NOTE — Assessment & Plan Note (Signed)
-   pt has hx of vit d deficiency will draw labs today

## 2023-07-03 NOTE — Assessment & Plan Note (Signed)
-   pt has some depressive like symptoms - depression symptoms are associated with work. Doesn't feel like they consume his day  - phq9: 5  - referral sent to behavioral health

## 2023-07-03 NOTE — Progress Notes (Signed)
New patient visit   Patient: Reginald Bush   DOB: May 03, 1981   42 y.o. Male  MRN: [id number] Visit Date: 07/03/2023  Today's healthcare provider: Charlton Amor, DO   Chief Complaint  Patient presents with   New Patient (Initial Visit)    Establish care pt would like to have physical done as well. Roselyn Reef, CMA     SUBJECTIVE    Chief Complaint  Patient presents with   New Patient (Initial Visit)    Establish care pt would like to have physical done as well. Roselyn Reef, CMA    HPI HPI     New Patient (Initial Visit)    Additional comments: Establish care pt would like to have physical done as well. Roselyn Reef, CMA       Last edited by Roselyn Reef, CMA on 07/03/2023  1:26 PM.       Pt presents to establish care.   Pmh HTN - on amlodipine-benazepril 5-20mg   - denies headaches, blurry vision - he was originally on amlodipine 10-benazepril 20mg  but ran out from his last provider and is on 5mg     Pt also admits to some depression like symptoms he is experiencing today. He also admits to some anxiety around his children and the water. He doesn't feel like these thoughts consume him.   Needs a refill on viagra   Review of Systems  Constitutional:  Negative for activity change, fatigue and fever.  Respiratory:  Negative for cough and shortness of breath.   Cardiovascular:  Negative for chest pain.  Gastrointestinal:  Negative for abdominal pain.  Genitourinary:  Negative for difficulty urinating.       Current Meds  Medication Sig   Omega-3 Fatty Acids (FISH OIL) 1000 MG CAPS Take by mouth.    OBJECTIVE    BP 124/78 (BP Location: Left Arm, Patient Position: Sitting, Cuff Size: Large)   Pulse 70   Resp 18   Ht 5\' 10"  (1.778 m)   Wt 222 lb 12 oz (101 kg)   SpO2 100%   BMI 31.96 kg/m   Physical Exam Vitals and nursing note reviewed.  Constitutional:      General: He is not in acute distress.    Appearance: Normal  appearance.  HENT:     Head: Normocephalic and atraumatic.     Right Ear: External ear normal.     Left Ear: External ear normal.     Nose: Nose normal.  Eyes:     Conjunctiva/sclera: Conjunctivae normal.  Cardiovascular:     Rate and Rhythm: Normal rate and regular rhythm.  Pulmonary:     Effort: Pulmonary effort is normal.     Breath sounds: Normal breath sounds.  Neurological:     General: No focal deficit present.     Mental Status: He is alert and oriented to person, place, and time.  Psychiatric:        Mood and Affect: Mood normal.        Behavior: Behavior normal.        Thought Content: Thought content normal.        Judgment: Judgment normal.        ASSESSMENT & PLAN    Problem List Items Addressed This Visit       Cardiovascular and Mediastinum   Primary hypertension - Primary    - will go ahead get some blood work today  - sent in refills of amlodipine-benazepril 5-20mg . BP well  controlled today so we do not need to increase - follow up in 3 months       Relevant Medications   sildenafil (VIAGRA) 100 MG tablet   amLODipine-benazepril (LOTREL) 5-20 MG capsule   Other Relevant Orders   Basic Metabolic Panel (BMET)   Lipid panel   CBC   Microalbumin / creatinine urine ratio   Magnesium     Other   Erectile dysfunction    - sent in refill of viagra       Relevant Medications   sildenafil (VIAGRA) 100 MG tablet   Vitamin D deficiency    - pt has hx of vit d deficiency will draw labs today      Relevant Orders   Vitamin D (25 hydroxy)   Depression    - pt has some depressive like symptoms - depression symptoms are associated with work. Doesn't feel like they consume his day  - phq9: 5  - referral sent to behavioral health      Relevant Orders   Ambulatory referral to Behavioral Health   Anxious mood    - pt has anxious feelings associated with his child swimming. Doesn't feel like they consume him - GAD7: 5      Other Visit Diagnoses      Low libido       Relevant Orders   Testosterone       Return in about 3 months (around 10/03/2023).      Meds ordered this encounter  Medications   sildenafil (VIAGRA) 100 MG tablet    Sig: Take 0.5-1 tablets (50-100 mg total) by mouth daily as needed for erectile dysfunction.    Dispense:  20 tablet    Refill:  0   amLODipine-benazepril (LOTREL) 5-20 MG capsule    Sig: Take 1 capsule by mouth daily.    Dispense:  90 capsule    Refill:  3    Refill plan    Orders Placed This Encounter  Procedures   Basic Metabolic Panel (BMET)    Order Specific Question:   Has the patient fasted?    Answer:   No   Lipid panel    Order Specific Question:   Has the patient fasted?    Answer:   No    Order Specific Question:   Release to patient    Answer:   Immediate   CBC   Microalbumin / creatinine urine ratio   Vitamin D (25 hydroxy)   Magnesium   Testosterone   Ambulatory referral to Behavioral Health    Referral Priority:   Routine    Referral Type:   Psychiatric    Referral Reason:   Specialty Services Required    Requested Specialty:   Behavioral Health    Number of Visits Requested:   1     Charlton Amor, DO  [facility] at [facility] [phone] (phone) [phone] (fax)  [facility]

## 2023-07-03 NOTE — Assessment & Plan Note (Signed)
-   sent in refill of viagra

## 2023-07-04 LAB — BASIC METABOLIC PANEL: Creatinine, Ser: 1.25 mg/dL (ref 0.76–1.27)

## 2023-07-07 ENCOUNTER — Encounter: Payer: Self-pay | Admitting: Family Medicine

## 2023-07-07 ENCOUNTER — Other Ambulatory Visit: Payer: Self-pay | Admitting: Family Medicine

## 2023-07-07 DIAGNOSIS — D72819 Decreased white blood cell count, unspecified: Secondary | ICD-10-CM

## 2023-07-09 ENCOUNTER — Other Ambulatory Visit: Payer: Self-pay | Admitting: Family Medicine

## 2023-07-09 DIAGNOSIS — R6882 Decreased libido: Secondary | ICD-10-CM | POA: Diagnosis not present

## 2023-07-09 DIAGNOSIS — D72819 Decreased white blood cell count, unspecified: Secondary | ICD-10-CM | POA: Diagnosis not present

## 2023-07-17 ENCOUNTER — Other Ambulatory Visit: Payer: Self-pay | Admitting: Family Medicine

## 2023-07-17 DIAGNOSIS — D72819 Decreased white blood cell count, unspecified: Secondary | ICD-10-CM

## 2023-07-21 ENCOUNTER — Other Ambulatory Visit: Payer: Self-pay | Admitting: Family Medicine

## 2023-07-21 MED ORDER — AMLODIPINE BESYLATE 5 MG PO TABS: 5.0000 mg | ORAL_TABLET | Freq: Every day | ORAL | 1 refills | Status: DC

## 2023-07-28 ENCOUNTER — Telehealth: Payer: Self-pay

## 2023-07-28 NOTE — Telephone Encounter (Signed)
Pt returned call and is going to come back to have labs redrawn. Roselyn Reef, CMA

## 2023-08-18 ENCOUNTER — Other Ambulatory Visit (HOSPITAL_BASED_OUTPATIENT_CLINIC_OR_DEPARTMENT_OTHER): Payer: Self-pay

## 2023-08-18 MED FILL — Amlodipine Besylate Tab 5 MG (Base Equivalent): ORAL | 90 days supply | Qty: 90 | Fill #0 | Status: AC

## 2023-10-06 ENCOUNTER — Ambulatory Visit: Payer: 59 | Admitting: Family Medicine

## 2023-10-09 ENCOUNTER — Encounter: Payer: Self-pay | Admitting: Family Medicine

## 2023-10-09 ENCOUNTER — Ambulatory Visit: Payer: 59 | Admitting: Family Medicine

## 2023-10-09 VITALS — BP 137/88 | HR 81 | Ht 70.0 in | Wt 222.8 lb

## 2023-10-09 DIAGNOSIS — D72819 Decreased white blood cell count, unspecified: Secondary | ICD-10-CM

## 2023-10-09 DIAGNOSIS — J029 Acute pharyngitis, unspecified: Secondary | ICD-10-CM

## 2023-10-09 DIAGNOSIS — I10 Essential (primary) hypertension: Secondary | ICD-10-CM | POA: Diagnosis not present

## 2023-10-09 LAB — POCT INFLUENZA A/B
Influenza A, POC: NEGATIVE
Influenza B, POC: NEGATIVE

## 2023-10-09 NOTE — Assessment & Plan Note (Addendum)
Pt feels like he has a sore throat again. Notes a few viral infections since the month started. He did recently start to feel better and then got the flu shot and felt worse again - will test for flu today  - flu negative so likely this is viral; recommend supportive care

## 2023-10-09 NOTE — Assessment & Plan Note (Addendum)
Pt on amlodipine 5mg   BP today is well controlled - continue current regimen

## 2023-10-09 NOTE — Assessment & Plan Note (Signed)
Reordering cbc with diff and platelets. If still low will need to send to heme for further investigation since we are limited and unable to order peripheral smears

## 2023-10-09 NOTE — Progress Notes (Signed)
Established patient visit   Patient: Reginald Bush   DOB: Oct 02, 1981   42 y.o. Male  MRN: [id number] Visit Date: 10/09/2023  Today's healthcare provider: Charlton Amor, DO   Chief Complaint  Patient presents with   Hypertension    SUBJECTIVE    Chief Complaint  Patient presents with   Hypertension   HPI    Pt presents for follow up. He was requested to get repeat blood work for leukopenia in August, but it has not yet been completed.   HTN - on amlodipine 5mg   - BP well controlled      Review of Systems  Constitutional:  Positive for fatigue. Negative for activity change and fever.  Respiratory:  Negative for cough and shortness of breath.   Cardiovascular:  Negative for chest pain.  Gastrointestinal:  Negative for abdominal pain.  Genitourinary:  Negative for difficulty urinating.       Current Meds  Medication Sig   amLODipine (NORVASC) 5 MG tablet Take 1 tablet (5 mg total) by mouth daily.   Ascorbic Acid (VITAMIN C) 1000 MG tablet Take 1,000 mg by mouth daily.   Multiple Vitamin (MULTIVITAMIN) tablet Take 1 tablet by mouth daily.   Omega-3 Fatty Acids (FISH OIL) 1000 MG CAPS Take by mouth.   sildenafil (VIAGRA) 100 MG tablet Take 0.5-1 tablets (50-100 mg total) by mouth daily as needed for erectile dysfunction.    OBJECTIVE    BP 137/88 (BP Location: Left Arm, Patient Position: Sitting, Cuff Size: Large)   Pulse 81   Ht 5\' 10"  (1.778 m)   Wt 222 lb 12 oz (101 kg)   SpO2 100%   BMI 31.96 kg/m   Physical Exam Vitals and nursing note reviewed.  Constitutional:      General: He is not in acute distress.    Appearance: Normal appearance.  HENT:     Head: Normocephalic and atraumatic.     Right Ear: External ear normal.     Left Ear: External ear normal.     Nose: Nose normal.  Eyes:     Conjunctiva/sclera: Conjunctivae normal.  Cardiovascular:     Rate and Rhythm: Normal rate and regular rhythm.  Pulmonary:     Effort: Pulmonary  effort is normal.     Breath sounds: Normal breath sounds.  Neurological:     General: No focal deficit present.     Mental Status: He is alert and oriented to person, place, and time.  Psychiatric:        Mood and Affect: Mood normal.        Behavior: Behavior normal.        Thought Content: Thought content normal.        Judgment: Judgment normal.        ASSESSMENT & PLAN    Problem List Items Addressed This Visit       Cardiovascular and Mediastinum   Essential hypertension    Pt on amlodipine 5mg   BP today is well controlled - continue current regimen        Other   Sore throat - Primary    Pt feels like he has a sore throat again. Notes a few viral infections since the month started. He did recently start to feel better and then got the flu shot and felt worse again - will test for flu today  - flu negative so likely this is viral; recommend supportive care      Relevant Orders  POCT Influenza A/B (Completed)   Leukopenia    Reordering cbc with diff and platelets. If still low will need to send to heme for further investigation since we are limited and unable to order peripheral smears      Relevant Orders   CBC w/Diff/Platelet    Return in about 7 months (around 05/03/2024).      No orders of the defined types were placed in this encounter.   Orders Placed This Encounter  Procedures   CBC w/Diff/Platelet   POCT Influenza A/B     Charlton Amor, DO  [facility] at [facility] [phone] (phone) [phone] (fax)  [facility]

## 2023-10-10 LAB — CBC WITH DIFFERENTIAL/PLATELET
Basophils Absolute: 0 10*3/uL (ref 0.0–0.2)
Basos: 1 %
EOS (ABSOLUTE): 0.1 10*3/uL (ref 0.0–0.4)
Eos: 1 %
Hematocrit: 45.9 % (ref 37.5–51.0)
Hemoglobin: 14.4 g/dL (ref 13.0–17.7)
Immature Grans (Abs): 0 10*3/uL (ref 0.0–0.1)
Immature Granulocytes: 0 %
Lymphocytes Absolute: 1.1 10*3/uL (ref 0.7–3.1)
Lymphs: 28 %
MCH: 28.9 pg (ref 26.6–33.0)
MCHC: 31.4 g/dL — ABNORMAL LOW (ref 31.5–35.7)
MCV: 92 fL (ref 79–97)
Monocytes Absolute: 0.5 10*3/uL (ref 0.1–0.9)
Monocytes: 12 %
Neutrophils Absolute: 2.4 10*3/uL (ref 1.4–7.0)
Neutrophils: 58 %
Platelets: 250 10*3/uL (ref 150–450)
RBC: 4.99 x10E6/uL (ref 4.14–5.80)
RDW: 13.8 % (ref 11.6–15.4)
WBC: 4.2 10*3/uL (ref 3.4–10.8)

## 2023-11-23 MED FILL — Amlodipine Besylate Tab 5 MG (Base Equivalent): ORAL | 60 days supply | Qty: 60 | Fill #1 | Status: AC

## 2023-11-24 ENCOUNTER — Other Ambulatory Visit (HOSPITAL_BASED_OUTPATIENT_CLINIC_OR_DEPARTMENT_OTHER): Payer: Self-pay

## 2024-01-16 ENCOUNTER — Other Ambulatory Visit: Payer: Self-pay | Admitting: Family Medicine

## 2024-01-16 DIAGNOSIS — N521 Erectile dysfunction due to diseases classified elsewhere: Secondary | ICD-10-CM

## 2024-01-16 MED ORDER — AMLODIPINE BESYLATE 5 MG PO TABS: 5.0000 mg | ORAL_TABLET | Freq: Every day | ORAL | 0 refills | Status: DC

## 2024-01-16 MED ORDER — SILDENAFIL CITRATE 100 MG PO TABS
50.0000 mg | ORAL_TABLET | Freq: Every day | ORAL | 0 refills | Status: DC | PRN
Start: 2024-01-16 — End: 2024-02-02

## 2024-01-29 ENCOUNTER — Other Ambulatory Visit (HOSPITAL_BASED_OUTPATIENT_CLINIC_OR_DEPARTMENT_OTHER): Payer: Self-pay

## 2024-01-29 ENCOUNTER — Other Ambulatory Visit: Payer: Self-pay | Admitting: Family Medicine

## 2024-01-30 ENCOUNTER — Other Ambulatory Visit: Payer: Self-pay | Admitting: Family Medicine

## 2024-01-30 ENCOUNTER — Other Ambulatory Visit (HOSPITAL_BASED_OUTPATIENT_CLINIC_OR_DEPARTMENT_OTHER): Payer: Self-pay

## 2024-01-30 DIAGNOSIS — N521 Erectile dysfunction due to diseases classified elsewhere: Secondary | ICD-10-CM

## 2024-01-30 NOTE — Telephone Encounter (Signed)
 Copied from CRM [id number]. Topic: Clinical - Medication Refill >> Jan 30, 2024 11:36 AM Fuller Mandril wrote: Most Recent Primary Care Visit:  Provider: Charlton Amor  Department: [facility]  Visit Type: OFFICE VISIT  Date: 10/09/2023  Medication:  amLODipine (NORVASC) 5 MG tablet - [facility] [place]  sildenafil (VIAGRA) 100 MG tablet   Has the patient contacted their pharmacy? Yes (Agent: If no, request that the patient contact the pharmacy for the refill. If patient does not wish to contact the pharmacy document the reason why and proceed with request.) (Agent: If yes, when and what did the pharmacy advise?) unable to refill   Is this the correct pharmacy for this prescription? Yes If no, delete pharmacy and type the correct one.  This is the patient's preferred pharmacy:  [id number] - [place], [place] - [address] AT [facility] [address] [place] [address] Phone: [phone] Fax: [phone]   Has the prescription been filled recently? No  Is the patient out of the medication? No - down to last 1 or 2  Has the patient been seen for an appointment in the last year OR does the patient have an upcoming appointment? Yes  Can we respond through MyChart? Yes  Agent: Please be advised that Rx refills may take up to 3 business days. We ask that you follow-up with your pharmacy.

## 2024-02-02 ENCOUNTER — Other Ambulatory Visit (HOSPITAL_BASED_OUTPATIENT_CLINIC_OR_DEPARTMENT_OTHER): Payer: Self-pay

## 2024-02-02 ENCOUNTER — Other Ambulatory Visit: Payer: Self-pay

## 2024-02-02 DIAGNOSIS — N521 Erectile dysfunction due to diseases classified elsewhere: Secondary | ICD-10-CM

## 2024-02-02 MED ORDER — AMLODIPINE BESYLATE 5 MG PO TABS
5.0000 mg | ORAL_TABLET | Freq: Every day | ORAL | 0 refills | Status: DC
  Filled 2024-02-02: qty 90, 90d supply, fill #0

## 2024-02-02 MED ORDER — SILDENAFIL CITRATE 100 MG PO TABS: 50.0000 mg | ORAL_TABLET | Freq: Every day | ORAL | 3 refills | Status: DC | PRN

## 2024-02-02 NOTE — Telephone Encounter (Signed)
 Requested medication (s) are due for refill today: routing for review  Requested medication (s) are on the active medication list: yes  Last refill:  01/16/24  Future visit scheduled: yes  Notes to clinic:  Unable to refill per protocol, cannot delegate. Provider not at this practice.      Requested Prescriptions  Pending Prescriptions Disp Refills   amLODipine (NORVASC) 5 MG tablet 90 tablet 0    Sig: Take 1 tablet (5 mg total) by mouth daily.     Cardiovascular: Calcium Channel Blockers 2 Passed - 02/02/2024  8:28 AM      Passed - Last BP in normal range    BP Readings from Last 1 Encounters:  10/09/23 137/88         Passed - Last Heart Rate in normal range    Pulse Readings from Last 1 Encounters:  10/09/23 81         Passed - Valid encounter within last 6 months    Recent Outpatient Visits           3 months ago Sore throat   [facility] at [facility], Colbert Coyer, DO   7 months ago Primary hypertension   [facility] at [facility], Colbert Coyer, DO       Future Appointments             In 3 months Charlton Amor, DO [facility] Medicine at [facility]             sildenafil (VIAGRA) 100 MG tablet 20 tablet 0    Sig: Take 0.5-1 tablets (50-100 mg total) by mouth daily as needed for erectile dysfunction.     Urology: Erectile Dysfunction Agents Failed - 02/02/2024  8:28 AM      Failed - AST in normal range and within 360 days    No results found for: "POCAST", "AST"       Failed - ALT in normal range and within 360 days    No results found for: "ALT", "LABALT", "POCALT"       Passed - Last BP in normal range    BP Readings from Last 1 Encounters:  10/09/23 137/88         Passed - Valid encounter within last 12 months    Recent Outpatient Visits           3 months ago Sore throat   [facility] Medicine at  [facility], Colbert Coyer, DO   7 months ago Primary hypertension   [facility] Primary Care & Sports Medicine at [facility], Colbert Coyer, DO       Future Appointments             In 3 months Tamera Punt Colbert Coyer, DO [facility] Medicine at [facility]

## 2024-02-02 NOTE — Telephone Encounter (Signed)
 Copied from CRM [id number]. Topic: Clinical - Prescription Issue >> Feb 02, 2024 12:18 PM Gaetano Hawthorne wrote: Reason for CRM: Patient checking on his amLODipine refill request - he states that they got 60 pills back in December from the [facility] - the [facility] will not filled this medication as they say it's a maintenance drug. Please call into the following pharmacy:  [facility] [place] [facility]  Phone: [phone] Fax: [phone]   The Patient also requested a refill on his sildenafil and that can go to the [facility] pharmacy on file for him.  [id number] - [place], [place] - [address] AT [facility] [address]  Phone: [phone] Fax: [phone]  Please call him to confirm.

## 2024-02-02 NOTE — Telephone Encounter (Signed)
 Forwarding request to Dr. Benjamin Stain covering for Morey Hummingbird, DO.  Cancelled [facility]ine at [facility] and resent to [facility] [place].  Patient also requesting rx rf of sildenafil 100mg   Last written 01/16/2024 Last OV 10/09/2023 Upcoming appt 054/30/2025

## 2024-04-15 ENCOUNTER — Encounter: Payer: Self-pay | Admitting: Family Medicine

## 2024-05-07 ENCOUNTER — Ambulatory Visit: Payer: 59 | Admitting: Family Medicine

## 2024-05-07 ENCOUNTER — Ambulatory Visit: Admitting: Family Medicine

## 2024-05-10 ENCOUNTER — Ambulatory Visit (INDEPENDENT_AMBULATORY_CARE_PROVIDER_SITE_OTHER): Admitting: Family Medicine

## 2024-05-10 ENCOUNTER — Other Ambulatory Visit (HOSPITAL_BASED_OUTPATIENT_CLINIC_OR_DEPARTMENT_OTHER): Payer: Self-pay

## 2024-05-10 ENCOUNTER — Encounter: Payer: Self-pay | Admitting: Family Medicine

## 2024-05-10 VITALS — BP 140/90 | HR 69 | Temp 98.3°F | Ht 70.0 in | Wt 235.0 lb

## 2024-05-10 DIAGNOSIS — I10 Essential (primary) hypertension: Secondary | ICD-10-CM | POA: Diagnosis not present

## 2024-05-10 DIAGNOSIS — N521 Erectile dysfunction due to diseases classified elsewhere: Secondary | ICD-10-CM

## 2024-05-10 MED ORDER — SILDENAFIL CITRATE 100 MG PO TABS
50.0000 mg | ORAL_TABLET | Freq: Every day | ORAL | 3 refills | Status: AC | PRN
  Filled 2024-05-10 – 2024-05-26 (×2): qty 20, 20d supply, fill #0

## 2024-05-10 MED ORDER — AMLODIPINE BESYLATE 5 MG PO TABS
5.0000 mg | ORAL_TABLET | Freq: Every day | ORAL | 0 refills | Status: AC
  Filled 2024-05-10 – 2024-05-26 (×2): qty 90, 90d supply, fill #0

## 2024-05-10 NOTE — Progress Notes (Signed)
   Established Patient Office Visit  Subjective   Patient ID: Reginald Bush, male    DOB: 01/22/1981  Age: 43 y.o. MRN: [id number]  Chief Complaint  Patient presents with   Establish Care    HPI  Hypertension Medication compliance: Taking amlodipine  5 mg daily as prescribed. Takes at night.  Denies chest pain, shortness of breath, lower extremity edema, vision changes, headaches.  Pertinent lab work: needs CMP will get at CPE appointment.  Monitoring at home: 130/75  Tolerating medication well: no side effects reported  Continue current medication regimen: no changes Follow-up: 3 months  Elevated in office, did not sleep well.   ED: Taking sildenafil  100 mg one hour before intercourse. No side effects reported.  Needs refills today.    Review of Systems  Eyes:  Negative for blurred vision and double vision.  Respiratory:  Negative for shortness of breath.   Cardiovascular:  Negative for chest pain and leg swelling.  Neurological:  Negative for headaches.      Objective:     BP (!) 140/90 (BP Location: Right Arm, Patient Position: Sitting, Cuff Size: Large)   Pulse 69   Temp 98.3 F (36.8 C) (Oral)   Ht 5\' 10"  (1.778 m)   Wt 235 lb (106.6 kg)   SpO2 99%   BMI 33.72 kg/m    Physical Exam Vitals and nursing note reviewed.  Constitutional:      General: He is not in acute distress.    Appearance: Normal appearance.  Cardiovascular:     Rate and Rhythm: Regular rhythm.     Heart sounds: Normal heart sounds.  Pulmonary:     Effort: Pulmonary effort is normal.     Breath sounds: Normal breath sounds.  Skin:    General: Skin is warm and dry.  Neurological:     General: No focal deficit present.     Mental Status: He is alert. Mental status is at baseline.  Psychiatric:        Mood and Affect: Mood normal.        Behavior: Behavior normal.        Thought Content: Thought content normal.        Judgment: Judgment normal.     No results found for  any visits on 05/10/24.    The 10-year ASCVD risk score (Arnett DK, et al., 2019) is: 6.5%    Assessment & Plan:   Problem List Items Addressed This Visit     Essential hypertension - Primary   Taking amlodipine  5 mg daily as prescribed. Takes at night.  Denies chest pain, shortness of breath, lower extremity edema, vision changes, headaches.  Blood pressures are well controlled at home, elevated today in office. Continue to monitor at home, will adjust medications if continues to be > 130/80. DASH diet. Moderate exercise, such as walking.  Refill sent. Follow-up in 3 months for med management.       Relevant Medications   amLODipine  (NORVASC ) 5 MG tablet   sildenafil  (VIAGRA ) 100 MG tablet   Erectile dysfunction   Taking sildenafil  100 mg with symptom control. Refill sent.       Relevant Medications   sildenafil  (VIAGRA ) 100 MG tablet  Agrees with plan of care discussed.  Questions answered.   Return in about 25 days (around 06/04/2024) for CPE with labs.    Mickiel Albany, FNP

## 2024-05-10 NOTE — Assessment & Plan Note (Signed)
 Taking sildenafil  100 mg with symptom control. Refill sent.

## 2024-05-10 NOTE — Assessment & Plan Note (Signed)
 Taking amlodipine  5 mg daily as prescribed. Takes at night.  Denies chest pain, shortness of breath, lower extremity edema, vision changes, headaches.  Blood pressures are well controlled at home, elevated today in office. Continue to monitor at home, will adjust medications if continues to be > 130/80. DASH diet. Moderate exercise, such as walking.  Refill sent. Follow-up in 3 months for med management.

## 2024-05-21 ENCOUNTER — Other Ambulatory Visit (HOSPITAL_BASED_OUTPATIENT_CLINIC_OR_DEPARTMENT_OTHER): Payer: Self-pay

## 2024-05-26 ENCOUNTER — Other Ambulatory Visit (HOSPITAL_BASED_OUTPATIENT_CLINIC_OR_DEPARTMENT_OTHER): Payer: Self-pay

## 2024-06-04 ENCOUNTER — Encounter: Admitting: Family Medicine

## 2024-06-09 ENCOUNTER — Encounter: Admitting: Family Medicine

## 2024-06-10 ENCOUNTER — Ambulatory Visit (INDEPENDENT_AMBULATORY_CARE_PROVIDER_SITE_OTHER): Admitting: Family Medicine

## 2024-06-10 ENCOUNTER — Encounter: Payer: Self-pay | Admitting: Family Medicine

## 2024-06-10 VITALS — BP 138/88 | HR 90 | Temp 98.2°F | Ht 70.0 in | Wt 235.0 lb

## 2024-06-10 DIAGNOSIS — L84 Corns and callosities: Secondary | ICD-10-CM | POA: Diagnosis not present

## 2024-06-10 DIAGNOSIS — Z1329 Encounter for screening for other suspected endocrine disorder: Secondary | ICD-10-CM

## 2024-06-10 DIAGNOSIS — Z13228 Encounter for screening for other metabolic disorders: Secondary | ICD-10-CM

## 2024-06-10 DIAGNOSIS — Z0000 Encounter for general adult medical examination without abnormal findings: Secondary | ICD-10-CM

## 2024-06-10 DIAGNOSIS — Z13220 Encounter for screening for lipoid disorders: Secondary | ICD-10-CM | POA: Diagnosis not present

## 2024-06-10 DIAGNOSIS — Z136 Encounter for screening for cardiovascular disorders: Secondary | ICD-10-CM

## 2024-06-10 NOTE — Progress Notes (Signed)
 Complete physical exam  Patient: Reginald Bush   DOB: 1981/02/04   43 y.o. Male  MRN: [id number]  Subjective:    Chief Complaint  Patient presents with   Annual Exam    Reginald Bush is a 43 y.o. male who presents today for a complete physical exam. He reports consuming a general diet. Home exercise routine includes cardio and strength training. He generally feels well. He reports sleeping fairly well. He does not have additional problems to discuss today.    Most recent fall risk assessment:    05/10/2024   10:25 AM  Fall Risk   Falls in the past year? 0     Most recent depression screenings:    06/10/2024    2:27 PM 05/10/2024   10:25 AM  PHQ 2/9 Scores  PHQ - 2 Score 0 1  PHQ- 9 Score  2    Vision:Within last year and Dental: No current dental problems and Receives regular dental care    Patient Care Team: Booker Darice SAUNDERS, FNP as PCP - General (Family Medicine)   Outpatient Medications Prior to Visit  Medication Sig   amLODipine  (NORVASC ) 5 MG tablet Take 1 tablet (5 mg total) by mouth daily.   Ascorbic Acid (VITAMIN C) 1000 MG tablet Take 1,000 mg by mouth daily.   Calcium Carb-Cholecalciferol (CALCIUM 500+D) 500-5 MG-MCG TABS Take by mouth.   cholecalciferol (VITAMIN D3) 25 MCG (1000 UNIT) tablet Take 1,000 Units by mouth daily.   sildenafil  (VIAGRA ) 100 MG tablet Take 0.5-1 tablets (50-100 mg total) by mouth daily as needed for erectile dysfunction.   Multiple Vitamin (MULTIVITAMIN) tablet Take 1 tablet by mouth daily. (Patient not taking: Reported on 06/10/2024)   Omega-3 Fatty Acids (FISH OIL) 1000 MG CAPS Take by mouth. (Patient not taking: Reported on 06/10/2024)   No facility-administered medications prior to visit.    ROS        Objective:     BP 138/88 (Cuff Size: Large)   Pulse 90   Temp 98.2 F (36.8 C) (Oral)   Ht 5' 10 (1.778 m)   Wt 235 lb (106.6 kg)   SpO2 100%   BMI 33.72 kg/m    Physical Exam Vitals and nursing note  reviewed.  Constitutional:      General: He is not in acute distress.    Appearance: Normal appearance.  HENT:     Right Ear: Tympanic membrane normal.     Left Ear: Tympanic membrane normal.     Nose: Nose normal.     Mouth/Throat:     Mouth: Mucous membranes are moist.     Pharynx: Oropharynx is clear.  Eyes:     Extraocular Movements: Extraocular movements intact.  Neck:     Thyroid: No thyroid tenderness.  Cardiovascular:     Rate and Rhythm: Normal rate and regular rhythm.     Pulses:          Radial pulses are 2+ on the right side and 2+ on the left side.     Heart sounds: Normal heart sounds, S1 normal and S2 normal.  Pulmonary:     Effort: Pulmonary effort is normal.     Breath sounds: Normal breath sounds.  Abdominal:     General: Bowel sounds are normal.     Palpations: Abdomen is soft.     Tenderness: There is no abdominal tenderness.  Musculoskeletal:        General: Normal range of motion.     Cervical  back: Normal range of motion.     Right lower leg: No edema.     Left lower leg: No edema.  Lymphadenopathy:     Cervical:     Right cervical: No superficial cervical adenopathy.    Left cervical: No superficial cervical adenopathy.  Skin:    General: Skin is warm and dry.  Neurological:     General: No focal deficit present.     Mental Status: He is alert. Mental status is at baseline.  Psychiatric:        Mood and Affect: Mood normal.        Behavior: Behavior normal.        Thought Content: Thought content normal.        Judgment: Judgment normal.      No results found for any visits on 06/10/24.     Assessment & Plan:    Routine Health Maintenance and Physical Exam  Immunization History  Administered Date(s) Administered   Hepatitis B, PED/ADOLESCENT 10/11/2013, 01/31/2014, 04/25/2014   Influenza-Unspecified 10/02/2021   Moderna Sars-Covid-2 Vaccination 06/01/2020, 06/29/2020   Tdap 08/29/2014    Health Maintenance  Topic Date Due   HPV  VACCINES (1 - 3-dose SCDM series) Never done   COVID-19 Vaccine (3 - 2024-25 season) 06/26/2024 (Originally 08/10/2023)   INFLUENZA VACCINE  07/09/2024   DTaP/Tdap/Td (2 - Td or Tdap) 08/29/2024   Hepatitis B Vaccines  Completed   Hepatitis C Screening  Completed   HIV Screening  Completed   Meningococcal B Vaccine  Aged Out    Discussed health benefits of physical activity, and encouraged him to engage in regular exercise appropriate for his age and condition.  Annual physical exam -     CBC  Encounter for lipid screening for cardiovascular disease -     Lipid panel  Encounter for screening for metabolic disorder -     Hemoglobin A1c -     Comprehensive metabolic panel with GFR  Screening for thyroid disorder -     TSH + free T4  Callus of foot -     Ambulatory referral to Podiatry      Routine labs ordered.  HCM up to date.  Anticipatory guidance regarding healthy weight, lifestyle and choices given. Recommend healthy diet.  Recommend approximately 150 minutes/week of moderate intensity exercise. Resistance training is good for building muscles and for bone health. Muscle mass helps to increase our metabolism and to burn more calories at rest.  Limit alcohol consumption: no more than one drink per day for women and 2 drinks per day for me. Recommend regular dental and vision exams. Always use seatbelt/lap and shoulder restraints. Recommend using smoke alarms and checking batteries at least twice a year. Recommend using sunscreen when outside.  Please know that I am here to help you with all of your health care goals and am happy to work with you to find a solution that works best for you.  The greatest advice I have received with any changes in life are to take it one step at a time, that even means if all you can focus on is the next 60 seconds, then do that and celebrate your victories.  With any changes in life, you will have set backs, and that is OK. The important thing  to remember is, if you have a set back, it is not a failure, it is an opportunity to try again! Agrees with plan of care discussed.  Questions answered.  Return in about 8 weeks (around 08/02/2024) for HTN.     Darice JONELLE Brownie, FNP

## 2024-06-11 ENCOUNTER — Ambulatory Visit: Payer: Self-pay | Admitting: Family Medicine

## 2024-06-11 LAB — CBC
Hematocrit: 44.1 % (ref 37.5–51.0)
Hemoglobin: 14.1 g/dL (ref 13.0–17.7)
MCH: 29.1 pg (ref 26.6–33.0)
MCHC: 32 g/dL (ref 31.5–35.7)
MCV: 91 fL (ref 79–97)
Platelets: 213 x10E3/uL (ref 150–450)
RBC: 4.84 x10E6/uL (ref 4.14–5.80)
RDW: 13.4 % (ref 11.6–15.4)
WBC: 2.7 x10E3/uL — ABNORMAL LOW (ref 3.4–10.8)

## 2024-06-11 LAB — COMPREHENSIVE METABOLIC PANEL WITH GFR
ALT: 16 IU/L (ref 0–44)
AST: 31 IU/L (ref 0–40)
Albumin: 4.4 g/dL (ref 4.1–5.1)
Alkaline Phosphatase: 61 IU/L (ref 44–121)
BUN/Creatinine Ratio: 9 (ref 9–20)
BUN: 12 mg/dL (ref 6–24)
Bilirubin Total: 0.5 mg/dL (ref 0.0–1.2)
CO2: 23 mmol/L (ref 20–29)
Calcium: 9.2 mg/dL (ref 8.7–10.2)
Chloride: 103 mmol/L (ref 96–106)
Creatinine, Ser: 1.28 mg/dL — ABNORMAL HIGH (ref 0.76–1.27)
Globulin, Total: 3.3 g/dL (ref 1.5–4.5)
Glucose: 88 mg/dL (ref 70–99)
Potassium: 4.4 mmol/L (ref 3.5–5.2)
Sodium: 140 mmol/L (ref 134–144)
Total Protein: 7.7 g/dL (ref 6.0–8.5)
eGFR: 71 mL/min/1.73 (ref >59)

## 2024-06-11 LAB — HEMOGLOBIN A1C
Est. average glucose Bld gHb Est-mCnc: 105 mg/dL
Hgb A1c MFr Bld: 5.3 % (ref 4.8–5.6)

## 2024-06-11 LAB — TSH+FREE T4
Free T4: 1.04 ng/dL (ref 0.82–1.77)
TSH: 2.04 u[IU]/mL (ref 0.450–4.500)

## 2024-06-11 LAB — LIPID PANEL
Chol/HDL Ratio: 2.8 ratio (ref 0.0–5.0)
Cholesterol, Total: 186 mg/dL (ref 100–199)
HDL: 66 mg/dL (ref >39)
LDL Chol Calc (NIH): 107 mg/dL — ABNORMAL HIGH (ref 0–99)
Triglycerides: 68 mg/dL (ref 0–149)
VLDL Cholesterol Cal: 13 mg/dL (ref 5–40)

## 2024-07-08 ENCOUNTER — Ambulatory Visit (INDEPENDENT_AMBULATORY_CARE_PROVIDER_SITE_OTHER): Admitting: Podiatry

## 2024-07-08 DIAGNOSIS — Z91199 Patient's noncompliance with other medical treatment and regimen due to unspecified reason: Secondary | ICD-10-CM

## 2024-07-08 NOTE — Progress Notes (Signed)
 Cancel 24 hours

## 2024-07-15 ENCOUNTER — Encounter: Payer: Self-pay | Admitting: Podiatry

## 2024-07-15 ENCOUNTER — Ambulatory Visit (INDEPENDENT_AMBULATORY_CARE_PROVIDER_SITE_OTHER): Admitting: Podiatry

## 2024-07-15 DIAGNOSIS — D2372 Other benign neoplasm of skin of left lower limb, including hip: Secondary | ICD-10-CM | POA: Diagnosis not present

## 2024-07-15 DIAGNOSIS — D2371 Other benign neoplasm of skin of right lower limb, including hip: Secondary | ICD-10-CM | POA: Diagnosis not present

## 2024-07-15 NOTE — Progress Notes (Signed)
  Subjective:  Patient ID: Reginald Bush, male    DOB: [birth year],   MRN: [id number]  Chief Complaint  Patient presents with   Callouses    I noticed that I  have calluses.  I don't know if it's from my shoes.  I been putting more lotion on them, so they have improved.  The left foot is the worse.    43 y.o. male presents for concern of lesions on bottom of both feet. Concern as above.  Denies any other pedal complaints. Denies n/v/f/c.   Past Medical History:  Diagnosis Date   Allergy    Depression     Objective:  Physical Exam: Vascular: DP/PT pulses 2/4 bilateral. CFT <3 seconds. Normal hair growth on digits. No edema.  Skin. No lacerations or abrasions bilateral feet. Hyperkeratotic cored lesion noted to bilateral plantar fifth metatarsal. Disruption of skin lines noted.  Musculoskeletal: MMT 5/5 bilateral lower extremities in DF, PF, Inversion and Eversion. Deceased ROM in DF of ankle joint.  Neurological: Sensation intact to light touch.   Assessment:   1. Benign neoplasm of skin of right foot   2. Benign neoplasm of skin of foot, left      Plan:  Patient was evaluated and treated and all questions answered. -Discussed benign skin lesions with patient and treatment options.  -Hyperkeratotic tissue was debrided with chisel without incident.  -Applied salycylic acid treatment to area with dressing. Advised to remove bandaging tomorrow.  -Encouraged daily moisturizing -Discussed use of pumice stone -Advised good supportive shoes and inserts -Patient to return to office as needed or sooner if condition worsens.   Asberry Failing, DPM

## 2024-08-03 ENCOUNTER — Ambulatory Visit: Admitting: Family Medicine

## 2024-08-03 ENCOUNTER — Other Ambulatory Visit (HOSPITAL_BASED_OUTPATIENT_CLINIC_OR_DEPARTMENT_OTHER): Payer: Self-pay

## 2024-08-03 ENCOUNTER — Encounter: Payer: Self-pay | Admitting: Family Medicine

## 2024-08-03 VITALS — BP 142/90 | HR 70 | Temp 98.1°F | Ht 69.0 in | Wt 233.0 lb

## 2024-08-03 DIAGNOSIS — I10 Essential (primary) hypertension: Secondary | ICD-10-CM | POA: Diagnosis not present

## 2024-08-03 MED ORDER — CHLORTHALIDONE 25 MG PO TABS
25.0000 mg | ORAL_TABLET | Freq: Every day | ORAL | 0 refills | Status: AC
Start: 2024-08-03 — End: ?
  Filled 2024-08-03: qty 30, 30d supply, fill #0

## 2024-08-03 NOTE — Progress Notes (Signed)
   Established Patient Office Visit  Subjective   Patient ID: Reginald Bush, male    DOB: May 18, 1981  Age: 43 y.o. MRN: [id number]  Chief Complaint  Patient presents with   Hypertension    Follow up    HPI  Hypertension Medication compliance: Taking amlodipine  5 mg daily Denies chest pain, shortness of breath, lower extremity edema, vision changes, headaches.  Pertinent lab work: 7/3: CMP: GFR: 71 Monitoring at home: 142/80  Tolerating medication well: no side effects  Continue current medication regimen: add chlorthalidone  25 mg daily  Follow-up: 2 weeks  Exercising 4-5 times per week. Watching sodium    ROS    Objective:     BP (!) 142/90 (Patient Position: Sitting, Cuff Size: Normal)   Pulse 70   Temp 98.1 F (36.7 C) (Oral)   Ht 5' 9 (1.753 m)   Wt 233 lb (105.7 kg)   SpO2 99%   BMI 34.41 kg/m    Physical Exam Vitals and nursing note reviewed.  Constitutional:      General: He is not in acute distress.    Appearance: Normal appearance.  Cardiovascular:     Rate and Rhythm: Normal rate and regular rhythm.     Heart sounds: Normal heart sounds.  Pulmonary:     Effort: Pulmonary effort is normal.     Breath sounds: Normal breath sounds.  Skin:    General: Skin is warm and dry.  Neurological:     General: No focal deficit present.     Mental Status: He is alert. Mental status is at baseline.  Psychiatric:        Mood and Affect: Mood normal.        Behavior: Behavior normal.        Thought Content: Thought content normal.        Judgment: Judgment normal.      No results found for any visits on 08/03/24.    The 10-year ASCVD risk score (Arnett DK, et al., 2019) is: 6.7%    Assessment & Plan:   Problem List Items Addressed This Visit     Essential hypertension - Primary   Taking amlodipine  5 mg daily Denies chest pain, shortness of breath, lower extremity edema, vision changes, headaches.  Pertinent lab work: 7/3: CMP: GFR:  71 Monitoring at home: 142/80  Not at goal. Chlorthalidone  25 mg added today. Follow-up in 2 weeks with BP log. DASH diet and continue exercising.        Relevant Medications   chlorthalidone  (HYGROTON ) 25 MG tablet  Agrees with plan of care discussed.  Questions answered.   Return in about 2 weeks (around 08/17/2024) for HTN.    Darice JONELLE Brownie, FNP

## 2024-08-03 NOTE — Assessment & Plan Note (Addendum)
 Taking amlodipine  5 mg daily Denies chest pain, shortness of breath, lower extremity edema, vision changes, headaches.  Pertinent lab work: 7/3: CMP: GFR: 71 Monitoring at home: 142/80  Not at goal. Chlorthalidone  25 mg added today. Follow-up in 2 weeks with BP log. DASH diet and continue exercising.

## 2024-08-17 ENCOUNTER — Ambulatory Visit: Admitting: Family Medicine

## 2024-08-17 ENCOUNTER — Other Ambulatory Visit (HOSPITAL_BASED_OUTPATIENT_CLINIC_OR_DEPARTMENT_OTHER): Payer: Self-pay
